# Patient Record
Sex: Female | Born: 1980 | Race: Black or African American | Hispanic: No | Smoking: Never smoker
Health system: Southern US, Community
[De-identification: ages and names within clinical notes are randomized; demographics above are authoritative.]

## PROBLEM LIST (undated history)

## (undated) DIAGNOSIS — I10 Essential (primary) hypertension: Secondary | ICD-10-CM

## (undated) DIAGNOSIS — K219 Gastro-esophageal reflux disease without esophagitis: Secondary | ICD-10-CM

## (undated) DIAGNOSIS — M1990 Unspecified osteoarthritis, unspecified site: Secondary | ICD-10-CM

## (undated) DIAGNOSIS — D649 Anemia, unspecified: Secondary | ICD-10-CM

## (undated) DIAGNOSIS — T7840XA Allergy, unspecified, initial encounter: Secondary | ICD-10-CM

## (undated) DIAGNOSIS — E785 Hyperlipidemia, unspecified: Secondary | ICD-10-CM

## (undated) DIAGNOSIS — G4730 Sleep apnea, unspecified: Secondary | ICD-10-CM

## (undated) HISTORY — DX: Anemia, unspecified: D64.9

## (undated) HISTORY — DX: Sleep apnea, unspecified: G47.30

## (undated) HISTORY — DX: Hyperlipidemia, unspecified: E78.5

## (undated) HISTORY — DX: Essential (primary) hypertension: I10

## (undated) HISTORY — DX: Allergy, unspecified, initial encounter: T78.40XA

## (undated) HISTORY — DX: Unspecified osteoarthritis, unspecified site: M19.90

## (undated) HISTORY — DX: Gastro-esophageal reflux disease without esophagitis: K21.9

---

## 2014-12-03 DIAGNOSIS — N971 Female infertility of tubal origin: Secondary | ICD-10-CM | POA: Insufficient documentation

## 2018-12-25 DIAGNOSIS — M5442 Lumbago with sciatica, left side: Secondary | ICD-10-CM | POA: Insufficient documentation

## 2019-09-17 ENCOUNTER — Other Ambulatory Visit: Payer: Self-pay

## 2019-09-18 ENCOUNTER — Encounter: Payer: Self-pay | Admitting: Gastroenterology

## 2019-09-18 ENCOUNTER — Ambulatory Visit (INDEPENDENT_AMBULATORY_CARE_PROVIDER_SITE_OTHER): Payer: 59 | Admitting: Gastroenterology

## 2019-09-18 VITALS — BP 138/84 | HR 74 | Ht 65.0 in | Wt 250.0 lb

## 2019-09-18 DIAGNOSIS — R1310 Dysphagia, unspecified: Secondary | ICD-10-CM | POA: Diagnosis not present

## 2019-09-18 DIAGNOSIS — Z01818 Encounter for other preprocedural examination: Secondary | ICD-10-CM | POA: Diagnosis not present

## 2019-09-18 DIAGNOSIS — K219 Gastro-esophageal reflux disease without esophagitis: Secondary | ICD-10-CM | POA: Diagnosis not present

## 2019-09-18 MED ORDER — SUCRALFATE 1 GM/10ML PO SUSP: 1.0000 g | Freq: Four times a day (QID) | ORAL | 1 refills | Status: DC | PRN

## 2019-09-18 MED ORDER — OMEPRAZOLE 40 MG PO CPDR: 40.0000 mg | DELAYED_RELEASE_CAPSULE | Freq: Two times a day (BID) | ORAL | 3 refills | Status: DC

## 2019-09-18 NOTE — Patient Instructions (Addendum)
If you are age 39 or older, your body mass index should be between 23-30. Your Body mass index is 41.6 kg/m. If this is out of the aforementioned range listed, please consider follow up with your Primary Care Provider.  If you are age 11 or younger, your body mass index should be between 19-25. Your Body mass index is 41.6 kg/m. If this is out of the aformentioned range listed, please consider follow up with your Primary Care Provider.   You have been scheduled for an endoscopy. Please follow written instructions given to you at your visit today. If you use inhalers (even only as needed), please bring them with you on the day of your procedure.   We have sent the following medications to your pharmacy for you to pick up at your convenience: Omeprazole 40 mg: Take twice a day, 30 minutes before a meal Carafate: Take 10 ml every 6 hours as needed  We will request your EGD records from Dr. Noe Gens at [facility]  Thank you for entrusting me with your care and for choosing [facility], Dr. Ileene Patrick

## 2019-09-18 NOTE — Progress Notes (Signed)
HPI :  39 year old female with a history of GERD, history of dysphagia, self-referred here for those symptoms.  New patient evaluation.  She is accompanied by 2 family members today.  They endorse longstanding symptoms for years.  They state the patient's had intermittent dysphagia which she localizes to her throat.  She has dysphagia to both solids and liquids in this area.  She is also had odynophagia as well.  She denies any pyrosis but does have some regurgitation, and waterbrash, states this can also make her ears ache.  She has a strong gag reflex when she brushes her teeth, states she can regurgitate " blood", states it is a very small amount.  She does not have any pain in her abdomen or epigastric area.  No nausea or vomiting.  She has been on some medications in the past, sounds like PPIs but then came off it.  She was placed on omeprazole 40 mg last week for the symptoms, states it has not helped much yet.  Symptoms can bother her at night when she sleeps.  They describe a history of 3 EGDs in the past, apparently done in [facility].  They state this has been done for dysphagia in the past.  Sounds like she has had dilations which have provided her benefit to her symptoms.  The last 1 sounds like 3 to 4 years ago, we do not have any records of these previously.  She states she did well with anesthesia.  She denies any other medical problems.  She states symptoms have worsened and recent months which led to her coming in to be reevaluated.    Past Medical History:  Diagnosis Date   GERD (gastroesophageal reflux disease)      Past Surgical History:  Procedure Laterality Date   CESAREAN SECTION     x3    Family History  Problem Relation Age of Onset   Stroke Father    Pancreatic cancer Neg Hx    Stomach cancer Neg Hx    Colon cancer Neg Hx    Esophageal cancer Neg Hx    Social History   Tobacco Use   Smoking status: Never Smoker   Smokeless tobacco: Never Used    Vaping Use   Vaping Use: Never used  Substance Use Topics   Alcohol use: Never   Drug use: Never   Current Outpatient Medications  Medication Sig Dispense Refill   omeprazole (PRILOSEC) 40 MG capsule Take 40 mg by mouth daily.     SUMAtriptan (IMITREX) 100 MG tablet Take 1 tablet by mouth as needed.     No current facility-administered medications for this visit.   Allergies  Allergen Reactions   Other Other (See Comments)    Patient does not like to take medications with dyes per patient.     Review of Systems: All systems reviewed and negative except where noted in HPI.    No results found.  Physical Exam: BP 138/84 (BP Location: Left Arm, Patient Position: Sitting, Cuff Size: Large)    Pulse 74    Ht 5\' 5"  (1.651 m)    Wt 250 lb (113.4 kg)    SpO2 100%    BMI 41.60 kg/m  Constitutional: Pleasant,well-developed, female in no acute distress. HEENT: Normocephalic and atraumatic. Conjunctivae are normal. No scleral icterus. Neck supple.  Cardiovascular: Normal rate, regular rhythm.  Pulmonary/chest: Effort normal and breath sounds normal. No wheezing, rales or rhonchi. Abdominal: Soft, nondistended, nontender.  There are no masses palpable.  Extremities: no edema Lymphadenopathy: No cervical adenopathy noted. Neurological: Alert and oriented to person place and time. Skin: Skin is warm and dry. No rashes noted. Psychiatric: Normal mood and affect. Behavior is normal.   ASSESSMENT AND PLAN: 39 year old female here for new patient assessment of the following:  Dysphagia / GERD / odynophagia - sounds like intermittent chronic symptoms of dysphagia and odynophagia, with water brash.  I do not have records of her prior endoscopies on file, but sounds like she has responded well to dilation in the past, they confidently state this has helped her.  They are asking for endoscopy to treat this, I think that is reasonable based on the symptoms she reports today.  I discussed  what endoscopy is with him, as well as dilation, risk benefits of that and anesthesia.  She wanted to proceed.  In the interim will increase omeprazole to 40 mg twice daily.  Recommend she take this 30 to 60 minutes before meal to maximize absorption and see if this helps.  I will also give them some liquid Carafate to use every 6 hours as needed to see if this will help soothe her throat.  Further recommendations pending the results of her endoscopy and her course.  We will otherwise try to get results of her prior endoscopies done in [facility] to clarify her history and findings.  She agreed with the plan, all questions answered.  Ileene Patrick, MD [facility]

## 2019-09-22 ENCOUNTER — Other Ambulatory Visit: Payer: Self-pay

## 2019-09-22 MED ORDER — SUCRALFATE 1 G PO TABS: 1.0000 g | ORAL_TABLET | Freq: Four times a day (QID) | ORAL | 3 refills | Status: DC | PRN

## 2019-09-22 NOTE — Progress Notes (Signed)
PA required for carafate suspension.  Sent script for tablets with instructions on how to make a slurry, asked pharmacy to inform patient.

## 2019-09-24 ENCOUNTER — Encounter: Payer: Self-pay | Admitting: Gastroenterology

## 2019-09-30 ENCOUNTER — Telehealth: Payer: Self-pay | Admitting: Gastroenterology

## 2019-09-30 NOTE — Telephone Encounter (Signed)
Results of prior EGDs arrived:  EGD 01/2013 - Dr. Nance Pew - "esophagitis, gastritis" - no details  EGD 02/19/13 - Dr. Nance Pew - "Barrett's esophagus, gastritis" - no details - path positive for H pylori and treated  EGD 03/04/14 - no report - only path - 3 differents biopsies of stomach, chronic gastritis HP negative, biopsies of GEJ and esophagus without barrett's

## 2019-10-01 ENCOUNTER — Ambulatory Visit (INDEPENDENT_AMBULATORY_CARE_PROVIDER_SITE_OTHER): Payer: 59

## 2019-10-01 ENCOUNTER — Other Ambulatory Visit: Payer: Self-pay | Admitting: Gastroenterology

## 2019-10-01 DIAGNOSIS — Z1159 Encounter for screening for other viral diseases: Secondary | ICD-10-CM

## 2019-10-01 LAB — SARS CORONAVIRUS 2 (TAT 6-24 HRS): SARS Coronavirus 2: NEGATIVE

## 2019-10-04 ENCOUNTER — Encounter: Payer: Self-pay | Admitting: Certified Registered Nurse Anesthetist

## 2019-10-05 ENCOUNTER — Other Ambulatory Visit: Payer: Self-pay

## 2019-10-05 ENCOUNTER — Ambulatory Visit (AMBULATORY_SURGERY_CENTER): Payer: 59 | Admitting: Gastroenterology

## 2019-10-05 ENCOUNTER — Encounter: Payer: Self-pay | Admitting: Gastroenterology

## 2019-10-05 VITALS — BP 156/96 | HR 71 | Temp 97.3°F | Resp 29 | Ht 65.0 in | Wt 250.0 lb

## 2019-10-05 DIAGNOSIS — R1310 Dysphagia, unspecified: Secondary | ICD-10-CM

## 2019-10-05 DIAGNOSIS — K228 Other specified diseases of esophagus: Secondary | ICD-10-CM | POA: Diagnosis not present

## 2019-10-05 DIAGNOSIS — K219 Gastro-esophageal reflux disease without esophagitis: Secondary | ICD-10-CM

## 2019-10-05 MED ORDER — SODIUM CHLORIDE 0.9 % IV SOLN: 500.0000 mL | INTRAVENOUS | Status: DC

## 2019-10-05 MED ORDER — OMEPRAZOLE 40 MG PO CPDR: 40.0000 mg | DELAYED_RELEASE_CAPSULE | Freq: Two times a day (BID) | ORAL | 3 refills | Status: DC

## 2019-10-05 NOTE — Progress Notes (Signed)
Vs HC

## 2019-10-05 NOTE — Progress Notes (Signed)
1615 Robinul 0.1 mg IV given due large amount of secretions upon assessment.  MD made aware, vss

## 2019-10-05 NOTE — Progress Notes (Signed)
Called to room to assist during endoscopic procedure.  Patient ID and intended procedure confirmed with present staff. Received instructions for my participation in the procedure from the performing physician.  

## 2019-10-05 NOTE — Op Note (Signed)
[facility] Patient Name: Anna Chapman Procedure Date: 10/05/2019 4:06 PM MRN: [id number] Endoscopist: Viviann Spare P. Adela Lank , MD Age: 39 Referring MD:  Date of Birth: [birth year] Gender: Female Account #: [id number] Procedure:                Upper GI endoscopy Indications:              Dysphagia, Follow-up of gastro-esophageal reflux                            disease - improved recently on omeprazole 40mg                             twice daily and addition of carafate, empiric                            dilation in the past per patient has provided                            benefit Medicines:                Monitored Anesthesia Care Procedure:                Pre-Anesthesia Assessment:                           - Prior to the procedure, a History and Physical                            was performed, and patient medications and                            allergies were reviewed. The patient's tolerance of                            previous anesthesia was also reviewed. The risks                            and benefits of the procedure and the sedation                            options and risks were discussed with the patient.                            All questions were answered, and informed consent                            was obtained. Prior Anticoagulants: The patient has                            taken no previous anticoagulant or antiplatelet                            agents. ASA Grade Assessment: III - A patient with  severe systemic disease. After reviewing the risks                            and benefits, the patient was deemed in                            satisfactory condition to undergo the procedure.                           After obtaining informed consent, the endoscope was                            passed under direct vision. Throughout the                            procedure, the patient's blood pressure, pulse, and                             oxygen saturations were monitored continuously. The                            Endoscope was introduced through the mouth, and                            advanced to the second part of duodenum. The upper                            GI endoscopy was accomplished without difficulty.                            The patient tolerated the procedure well. Scope In: Scope Out: Findings:                 Esophagogastric landmarks were identified: the                            Z-line was found at 38 cm and the gastroesophageal                            junction was found at 38 cm from the incisors.                           The exam of the esophagus was otherwise normal. No                            focal strictures / stenosis. No inflammatory                            changes.                           A guidewire was placed and the scope was withdrawn.  Empiric dilation was performed in the entire                            esophagus with a Savary dilator with mild                            resistance at 17 mm and 18 mm. Relook endoscopy                            showed no mucosal wrents. Biopsies were taken with                            a cold forceps in the upper third of the esophagus,                            in the middle third of the esophagus and in the                            lower third of the esophagus for histology to rule                            out EoE.                           The entire examined stomach was normal.                           The duodenal bulb and second portion of the                            duodenum were normal. Complications:            No immediate complications. Estimated blood loss:                            Minimal. Estimated Blood Loss:     Estimated blood loss was minimal. Impression:               - Esophagogastric landmarks identified.                           - Normal esophagus  otherwise - empiric dilation                            performed to 18mm and biopsies taken to rule out EoE                           - Normal stomach.                           - Normal duodenal bulb and second portion of the                            duodenum.  Patient has nonerosive reflux disease (NERD). Seems                            to be improved on higher dose PPI, however if                            symptoms persist consider 24 hour pH impedance                            testing and manometry. Recommendation:           - Patient has a contact number available for                            emergencies. The signs and symptoms of potential                            delayed complications were discussed with the                            patient. Return to normal activities tomorrow.                            Written discharge instructions were provided to the                            patient.                           - Resume previous diet.                           - Continue present medications. (Will refill                            omeprazole 40mg  twice daily per patient request)                           - Await pathology results and course post dilation Mackenzie Lia P. Giulliana Mcroberts, MD 10/05/2019 4:43:26 PM This report has been signed electronically.

## 2019-10-05 NOTE — Progress Notes (Signed)
Report given to PACU, vss 

## 2019-10-05 NOTE — Patient Instructions (Signed)
Refill prescription sent for Omeprazole 40mg , take twice daily.   YOU HAD AN ENDOSCOPIC PROCEDURE TODAY AT THE [facility]:   Refer to the procedure report that was given to you for any specific questions about what was found during the examination.  If the procedure report does not answer your questions, please call your gastroenterologist to clarify.  If you requested that your care partner not be given the details of your procedure findings, then the procedure report has been included in a sealed envelope for you to review at your convenience later.  YOU SHOULD EXPECT: Some feelings of bloating in the abdomen. Passage of more gas than usual.  Walking can help get rid of the air that was put into your GI tract during the procedure and reduce the bloating. If you had a lower endoscopy (such as a colonoscopy or flexible sigmoidoscopy) you may notice spotting of blood in your stool or on the toilet paper. If you underwent a bowel prep for your procedure, you may not have a normal bowel movement for a few days.  Please Note:  You might notice some irritation and congestion in your nose or some drainage.  This is from the oxygen used during your procedure.  There is no need for concern and it should clear up in a day or so.  SYMPTOMS TO REPORT IMMEDIATELY:   Following upper endoscopy (EGD)  Vomiting of blood or coffee ground material  New chest pain or pain under the shoulder blades  Painful or persistently difficult swallowing  New shortness of breath  Fever of 100F or higher  Black, tarry-looking stools  For urgent or emergent issues, a gastroenterologist can be reached at any hour by calling [phone]. Do not use MyChart messaging for urgent concerns.    DIET:  We do recommend a small meal at first, but then you may proceed to your regular diet.  Drink plenty of fluids but you should avoid alcoholic beverages for 24 hours.  ACTIVITY:  You should plan to take it easy for  the rest of today and you should NOT DRIVE or use heavy machinery until tomorrow (because of the sedation medicines used during the test).    FOLLOW UP: Our staff will call the number listed on your records 48-72 hours following your procedure to check on you and address any questions or concerns that you may have regarding the information given to you following your procedure. If we do not reach you, we will leave a message.  We will attempt to reach you two times.  During this call, we will ask if you have developed any symptoms of COVID 19. If you develop any symptoms (ie: fever, flu-like symptoms, shortness of breath, cough etc.) before then, please call [phone].  If you test positive for Covid 19 in the 2 weeks post procedure, please call and report this information to [phone].    If any biopsies were taken you will be contacted by phone or by letter within the next 1-3 weeks.  Please call us at [phone] if you have not heard about the biopsies in 3 weeks.    SIGNATURES/CONFIDENTIALITY: You and/or your care partner have signed paperwork which will be entered into your electronic medical record.  These signatures attest to the fact that that the information above on your After Visit Summary has been reviewed and is understood.  Full responsibility of the confidentiality of this discharge information lies with you and/or your care-partner.

## 2019-10-07 ENCOUNTER — Telehealth: Payer: Self-pay | Admitting: *Deleted

## 2019-10-07 NOTE — Telephone Encounter (Signed)
°  Follow up Call-  Call back number 10/05/2019  Post procedure Call Back phone  # [phone]  Permission to leave phone message Yes     Patient questions:  Message left to call us if necessary.

## 2019-10-07 NOTE — Telephone Encounter (Signed)
  Follow up Call-  Call back number 10/05/2019  Post procedure Call Back phone  # [phone]  Permission to leave phone message Yes     Patient questions:  Do you have a fever, pain , or abdominal swelling? No. Pain Score  0 *  Have you tolerated food without any problems? Yes.    Have you been able to return to your normal activities? Yes.    Do you have any questions about your discharge instructions: Diet   No. Medications  No. Follow up visit  No.  Do you have questions or concerns about your Care? No.  Actions: * If pain score is 4 or above: No action needed, pain <4.  1. Have you developed a fever since your procedure? no  2.   Have you had an respiratory symptoms (SOB or cough) since your procedure? no  3.   Have you tested positive for COVID 19 since your procedure no  4.   Have you had any family members/close contacts diagnosed with the COVID 19 since your procedure?  no   If yes to any of these questions please route to Laverna Peace, RN and Karlton Lemon, RN

## 2019-12-18 ENCOUNTER — Telehealth: Payer: Self-pay | Admitting: Family Medicine

## 2019-12-18 ENCOUNTER — Ambulatory Visit: Payer: 59 | Admitting: Family Medicine

## 2019-12-18 NOTE — Telephone Encounter (Cosign Needed)
Called pt to schedule referral appt w/ Dr. Jordan Likes for knee pain--no answer--message left.  --glh

## 2019-12-31 ENCOUNTER — Other Ambulatory Visit: Payer: Self-pay

## 2019-12-31 ENCOUNTER — Ambulatory Visit: Payer: Self-pay

## 2019-12-31 ENCOUNTER — Ambulatory Visit (INDEPENDENT_AMBULATORY_CARE_PROVIDER_SITE_OTHER): Payer: 59 | Admitting: Family Medicine

## 2019-12-31 ENCOUNTER — Encounter: Payer: Self-pay | Admitting: Family Medicine

## 2019-12-31 VITALS — BP 135/86 | HR 81 | Ht 64.0 in | Wt 250.0 lb

## 2019-12-31 DIAGNOSIS — M222X1 Patellofemoral disorders, right knee: Secondary | ICD-10-CM

## 2019-12-31 DIAGNOSIS — M222X2 Patellofemoral disorders, left knee: Secondary | ICD-10-CM

## 2019-12-31 MED ORDER — DICLOFENAC SODIUM 1 % EX GEL: 4.0000 g | Freq: Four times a day (QID) | CUTANEOUS | 11 refills | Status: DC

## 2019-12-31 MED ORDER — PREDNISONE 5 MG PO TABS: ORAL_TABLET | ORAL | 0 refills | Status: DC

## 2019-12-31 NOTE — Assessment & Plan Note (Signed)
Has significant weakness with hip abduction which is likely contributing.  She does have degenerative changes appreciated ultrasound. -Counseled on home exercise therapy and supportive care. -Prednisone. -Can try Voltaren. -Could consider injection, imaging or physical therapy.

## 2019-12-31 NOTE — Progress Notes (Signed)
  Anna Chapman - 39 y.o. female MRN [id number]  Date of birth: 04-04-1980  SUBJECTIVE:  Including CC & ROS.  Chief Complaint  Patient presents with  . Knee Pain    bilateral  . Hip Pain    bilateral    Anna Chapman is a 39 y.o. female that is presenting with acute on chronic bilateral knee pain.  Pain is been ongoing for most of the year.  She has tried medications which do alleviate the pain but the pain usually returns.  She is able to stand long periods of time as the pain exacerbates.  She denies any injury or inciting event.  Has not taken any pain medication recently.  Has not tried therapy.  No prior history of surgery.   Review of Systems See HPI   HISTORY: Past Medical, Surgical, Social, and Family History Reviewed & Updated per EMR.   Pertinent Historical Findings include:  Past Medical History:  Diagnosis Date  . GERD (gastroesophageal reflux disease)   . Hypertension     Past Surgical History:  Procedure Laterality Date  . CESAREAN SECTION     x3     Family History  Problem Relation Age of Onset  . Stroke Father   . Pancreatic cancer Neg Hx   . Stomach cancer Neg Hx   . Colon cancer Neg Hx   . Esophageal cancer Neg Hx     Social History   Socioeconomic History  . Marital status: Legally Separated    Spouse name: Not on file  . Number of children: Not on file  . Years of education: Not on file  . Highest education level: Not on file  Occupational History  . Not on file  Tobacco Use  . Smoking status: Never Smoker  . Smokeless tobacco: Never Used  Vaping Use  . Vaping Use: Never used  Substance and Sexual Activity  . Alcohol use: Never  . Drug use: Never  . Sexual activity: Not on file  Other Topics Concern  . Not on file  Social History Narrative  . Not on file   Social Determinants of Health   Financial Resource Strain: Not on file  Food Insecurity: Not on file  Transportation Needs: Not on file  Physical Activity: Not on file  Stress:  Not on file  Social Connections: Not on file  Intimate Partner Violence: Not on file     PHYSICAL EXAM:  VS: BP 135/86   Pulse 81   Ht 5\' 4"  (1.626 m)   Wt 250 lb (113.4 kg)   BMI 42.91 kg/m  Physical Exam Gen: NAD, alert, cooperative with exam, well-appearing MSK:  Right and left knee: No obvious effusion. Normal range of motion. Normal strength resistance. Weakness with hip abduction. Neurovascular intact  Limited ultrasound: Right knee:  Trace effusion. Normal-appearing quadricep and patellar tendon. Minor degenerative changes of the medial joint space and lateral joint space  Summary: Degenerative joint changes appreciated  Ultrasound and interpretation by , MD    ASSESSMENT & PLAN:   Patellofemoral pain syndrome of both knees Has significant weakness with hip abduction which is likely contributing.  She does have degenerative changes appreciated ultrasound. -Counseled on home exercise therapy and supportive care. -Prednisone. -Can try Voltaren. -Could consider injection, imaging or physical therapy.

## 2019-12-31 NOTE — Patient Instructions (Signed)
Nice to meet you Please try ice as needed Please try the exercises  Please try the rub on medicine after the prednisone   Please send me a message in MyChart with any questions or updates.  Please see me back in 4 weeks.   --Dr. Jordan Likes

## 2020-02-02 ENCOUNTER — Other Ambulatory Visit: Payer: Self-pay

## 2020-02-02 ENCOUNTER — Telehealth (INDEPENDENT_AMBULATORY_CARE_PROVIDER_SITE_OTHER): Payer: 59 | Admitting: Family Medicine

## 2020-02-02 DIAGNOSIS — M222X1 Patellofemoral disorders, right knee: Secondary | ICD-10-CM

## 2020-02-02 DIAGNOSIS — M222X2 Patellofemoral disorders, left knee: Secondary | ICD-10-CM

## 2020-02-02 NOTE — Progress Notes (Signed)
Virtual Visit via Video Note  I connected with Anna Chapman on 02/02/20 at  9:10 AM EST by a video enabled telemedicine application and verified that I am speaking with the correct person using two identifiers.  Location: Patient: home Provider: office   I discussed the limitations of evaluation and management by telemedicine and the availability of in person appointments. The patient expressed understanding and agreed to proceed.  History of Present Illness:  Ms. Anna Chapman is a 40 year old female following up for her bilateral knee pain.  She still having the pain.  No improvement with medications.  Pain seems to be staying the same.   Observations/Objective:  Gen: NAD, alert, cooperative with exam, well-appearing   Assessment and Plan:  Patellofemoral syndrome: Pain is still occurring.  No improvement with medications. -Counseled on home exercise therapy and supportive care. -Pursue injection.  Follow Up Instructions:    I discussed the assessment and treatment plan with the patient. The patient was provided an opportunity to ask questions and all were answered. The patient agreed with the plan and demonstrated an understanding of the instructions.   The patient was advised to call back or seek an in-person evaluation if the symptoms worsen or if the condition fails to improve as anticipated.    Anna Gandy, MD

## 2020-02-02 NOTE — Assessment & Plan Note (Signed)
Pain is still occurring.  No improvement with medications. -Counseled on home exercise therapy and supportive care. -Pursue injection.

## 2020-04-22 ENCOUNTER — Ambulatory Visit (INDEPENDENT_AMBULATORY_CARE_PROVIDER_SITE_OTHER): Payer: 59 | Admitting: Family Medicine

## 2020-04-22 ENCOUNTER — Ambulatory Visit: Payer: Self-pay

## 2020-04-22 ENCOUNTER — Encounter: Payer: Self-pay | Admitting: Family Medicine

## 2020-04-22 ENCOUNTER — Ambulatory Visit (HOSPITAL_BASED_OUTPATIENT_CLINIC_OR_DEPARTMENT_OTHER)
Admission: RE | Admit: 2020-04-22 | Discharge: 2020-04-22 | Disposition: A | Discharge status: Home / Self Care | Payer: 59 | Source: Ambulatory Visit | Attending: Family Medicine | Admitting: Family Medicine

## 2020-04-22 ENCOUNTER — Other Ambulatory Visit: Payer: Self-pay

## 2020-04-22 DIAGNOSIS — M222X1 Patellofemoral disorders, right knee: Secondary | ICD-10-CM

## 2020-04-22 DIAGNOSIS — M222X2 Patellofemoral disorders, left knee: Secondary | ICD-10-CM | POA: Insufficient documentation

## 2020-04-22 MED ORDER — MELOXICAM 7.5 MG PO TABS: 7.5000 mg | ORAL_TABLET | Freq: Two times a day (BID) | ORAL | 1 refills | Status: DC | PRN

## 2020-04-22 MED ORDER — TRIAMCINOLONE ACETONIDE 40 MG/ML IJ SUSP
40.0000 mg | Freq: Once | INTRAMUSCULAR | Status: AC
  Administered 2020-04-22: 40 mg via INTRA_ARTICULAR

## 2020-04-22 NOTE — Progress Notes (Signed)
Anna Chapman - 40 y.o. female MRN [id number]  Date of birth: 1980/07/13  SUBJECTIVE:  Including CC & ROS.  No chief complaint on file.   Anna Chapman is a 40 y.o. female that is presenting with acute on chronic bilateral knee pain.  She has tried home therapy and over-the-counter medication with limited improvement..   Review of Systems See HPI   HISTORY: Past Medical, Surgical, Social, and Family History Reviewed & Updated per EMR.   Pertinent Historical Findings include:  Past Medical History:  Diagnosis Date  . GERD (gastroesophageal reflux disease)   . Hypertension     Past Surgical History:  Procedure Laterality Date  . CESAREAN SECTION     x3     Family History  Problem Relation Age of Onset  . Stroke Father   . Pancreatic cancer Neg Hx   . Stomach cancer Neg Hx   . Colon cancer Neg Hx   . Esophageal cancer Neg Hx     Social History   Socioeconomic History  . Marital status: Legally Separated    Spouse name: Not on file  . Number of children: Not on file  . Years of education: Not on file  . Highest education level: Not on file  Occupational History  . Not on file  Tobacco Use  . Smoking status: Never Smoker  . Smokeless tobacco: Never Used  Vaping Use  . Vaping Use: Never used  Substance and Sexual Activity  . Alcohol use: Never  . Drug use: Never  . Sexual activity: Not on file  Other Topics Concern  . Not on file  Social History Narrative  . Not on file   Social Determinants of Health   Financial Resource Strain: Not on file  Food Insecurity: Not on file  Transportation Needs: Not on file  Physical Activity: Not on file  Stress: Not on file  Social Connections: Not on file  Intimate Partner Violence: Not on file     PHYSICAL EXAM:  VS: BP (!) 150/102 (BP Location: Left Arm, Patient Position: Sitting, Cuff Size: Large)   Ht 5\' 4"  (1.626 m)   Wt 250 lb (113.4 kg)   BMI 42.91 kg/m  Physical Exam Gen: NAD, alert, cooperative with  exam, well-appearing MSK:  Right and left knee: No obvious effusion. Normal range of motion. Normal strength resistance. Neurovascular intact   Aspiration/Injection Procedure Note Anna Chapman 1980/12/10  Procedure: Injection Indications: Left knee pain  Procedure Details Consent: Risks of procedure as well as the alternatives and risks of each were explained to the (patient/caregiver).  Consent for procedure obtained. Time Out: Verified patient identification, verified procedure, site/side was marked, verified correct patient position, special equipment/implants available, medications/allergies/relevent history reviewed, required imaging and test results available.  Performed.  The area was cleaned with iodine and alcohol swabs.    The Left knee superior lateral suprapatellar pouch was injected using 3 cc of 1% lidocaine on a 21-gauge 2 inch needle.  The syringe was switched to mixture containing 1 cc's of 40 mg Kenalog and 4 cc's of 0.25% bupivacaine was injected.  Ultrasound was used. Images were obtained in long views showing the injection.     A sterile dressing was applied.  Patient did tolerate procedure well.  Aspiration/Injection Procedure Note Anna Chapman 11/21/1980  Procedure: Injection Indications: Right knee pain  Procedure Details Consent: Risks of procedure as well as the alternatives and risks of each were explained to the (patient/caregiver).  Consent for procedure obtained. Time Out:  Verified patient identification, verified procedure, site/side was marked, verified correct patient position, special equipment/implants available, medications/allergies/relevent history reviewed, required imaging and test results available.  Performed.  The area was cleaned with iodine and alcohol swabs.    The right knee superior lateral suprapatellar pouch was injected using 3 cc of 1% lidocaine on a 21-gauge 2 inch needle.  The syringe was switched to mixture containing 1 cc's  of 40 mg Kenalog and 4 cc's of 0.25% bupivacaine was injected.  Ultrasound was used. Images were obtained in long views showing the injection.     A sterile dressing was applied.  Patient did tolerate procedure well.   ASSESSMENT & PLAN:   Patellofemoral pain syndrome of both knees Acute on chronic in nature.  May have an underlying component of degenerative changes  -Counseled on home exercise therapy and supportive care. -Injections. -X-ray. -Meloxicam. -Could consider physical therapy.

## 2020-04-22 NOTE — Assessment & Plan Note (Addendum)
Acute on chronic in nature.  May have an underlying component of degenerative changes  -Counseled on home exercise therapy and supportive care. -Injections. -X-ray. -Meloxicam. -Could consider physical therapy.

## 2020-04-22 NOTE — Patient Instructions (Signed)
Good to see you  Please try ice as needed  Please use the mobic as needed  I will call with the xray results.  Please send me a message in MyChart with any questions or updates.  Please see me back in 4 weeks.   --Dr. Jordan Likes

## 2020-04-27 ENCOUNTER — Telehealth: Payer: Self-pay | Admitting: Family Medicine

## 2020-04-27 NOTE — Telephone Encounter (Signed)
Left VM for patient. If she calls back please have her speak with a nurse/CMA and inform that her xrays are showing mild degenerative changes. We could consider physical therapy to help with the pain   If any questions then please take the best time and phone number to call and I will try to call her back.   Myra Rude, MD [facility] 04/27/2020, 8:26 AM

## 2020-05-02 ENCOUNTER — Encounter: Payer: Self-pay | Admitting: *Deleted

## 2020-05-02 NOTE — Telephone Encounter (Signed)
Letter mailed to pt.  

## 2020-05-20 ENCOUNTER — Other Ambulatory Visit: Payer: Self-pay

## 2020-05-20 ENCOUNTER — Encounter: Payer: Self-pay | Admitting: Family Medicine

## 2020-05-20 ENCOUNTER — Ambulatory Visit (INDEPENDENT_AMBULATORY_CARE_PROVIDER_SITE_OTHER): Payer: 59 | Admitting: Family Medicine

## 2020-05-20 VITALS — BP 188/118 | Ht 64.0 in | Wt 250.0 lb

## 2020-05-20 DIAGNOSIS — M222X1 Patellofemoral disorders, right knee: Secondary | ICD-10-CM

## 2020-05-20 DIAGNOSIS — M222X2 Patellofemoral disorders, left knee: Secondary | ICD-10-CM

## 2020-05-20 NOTE — Assessment & Plan Note (Signed)
Acute worsening of her bilateral knee pain.  Concern for chondral lesion to the chronicity of her symptoms.  Has been ongoing for about 6 months.  Have tried home therapy, medications and injection therapy. -Counseled on home exercise therapy and supportive care. -Referral to physical therapy. -MRI of the left and right knee to evaluate for chondral lesion or internal derangement.

## 2020-05-20 NOTE — Patient Instructions (Signed)
Good to see you Please try physical therapy  [facility]   Please send me a message in MyChart with any questions or updates.  We will setup a virtual visit once the MRI is resulted.   --Dr. Jordan Likes

## 2020-05-20 NOTE — Progress Notes (Signed)
  Anna Chapman - 40 y.o. female MRN [id number]  Date of birth: [birth year]  SUBJECTIVE:  Including CC & ROS.  No chief complaint on file.   Anna Chapman is a 40 y.o. female that is presenting with acute worsening of her bilateral knee pain.  We have tried injections and home therapy.  Tried medications with limited improvement.  Pain is still anterior in nature and worse when she is active.   Review of Systems See HPI   HISTORY: Past Medical, Surgical, Social, and Family History Reviewed & Updated per EMR.   Pertinent Historical Findings include:  Past Medical History:  Diagnosis Date  . GERD (gastroesophageal reflux disease)   . Hypertension     Past Surgical History:  Procedure Laterality Date  . CESAREAN SECTION     x3     Family History  Problem Relation Age of Onset  . Stroke Father   . Pancreatic cancer Neg Hx   . Stomach cancer Neg Hx   . Colon cancer Neg Hx   . Esophageal cancer Neg Hx     Social History   Socioeconomic History  . Marital status: Legally Separated    Spouse name: Not on file  . Number of children: Not on file  . Years of education: Not on file  . Highest education level: Not on file  Occupational History  . Not on file  Tobacco Use  . Smoking status: Never Smoker  . Smokeless tobacco: Never Used  Vaping Use  . Vaping Use: Never used  Substance and Sexual Activity  . Alcohol use: Never  . Drug use: Never  . Sexual activity: Not on file  Other Topics Concern  . Not on file  Social History Narrative  . Not on file   Social Determinants of Health   Financial Resource Strain: Not on file  Food Insecurity: Not on file  Transportation Needs: Not on file  Physical Activity: Not on file  Stress: Not on file  Social Connections: Not on file  Intimate Partner Violence: Not on file     PHYSICAL EXAM:  VS: BP (!) 188/118 (BP Location: Left Arm, Patient Position: Sitting, Cuff Size: Large)   Ht 5\' 4"  (1.626 m)   Wt 250 lb (113.4 kg)    BMI 42.91 kg/m  Physical Exam Gen: NAD, alert, cooperative with exam, well-appearing MSK:  Right and left knee: Effusion noted. Positive grind test. No instability. Neurovascular intact     ASSESSMENT & PLAN:   Patellofemoral pain syndrome of both knees Acute worsening of her bilateral knee pain.  Concern for chondral lesion to the chronicity of her symptoms.  Has been ongoing for about 6 months.  Have tried home therapy, medications and injection therapy. -Counseled on home exercise therapy and supportive care. -Referral to physical therapy. -MRI of the left and right knee to evaluate for chondral lesion or internal derangement.

## 2020-06-03 ENCOUNTER — Ambulatory Visit: Payer: 59 | Attending: Family Medicine | Admitting: Physical Therapy

## 2020-06-04 ENCOUNTER — Ambulatory Visit (HOSPITAL_BASED_OUTPATIENT_CLINIC_OR_DEPARTMENT_OTHER): Payer: 59

## 2020-06-11 ENCOUNTER — Ambulatory Visit (HOSPITAL_BASED_OUTPATIENT_CLINIC_OR_DEPARTMENT_OTHER): Payer: 59

## 2020-09-13 ENCOUNTER — Other Ambulatory Visit: Payer: Self-pay

## 2020-09-13 ENCOUNTER — Other Ambulatory Visit: Payer: Self-pay | Admitting: Gastroenterology

## 2020-09-13 ENCOUNTER — Ambulatory Visit (INDEPENDENT_AMBULATORY_CARE_PROVIDER_SITE_OTHER): Payer: 59 | Admitting: Family Medicine

## 2020-09-13 ENCOUNTER — Encounter: Payer: Self-pay | Admitting: Family Medicine

## 2020-09-13 VITALS — Ht 64.0 in | Wt 250.0 lb

## 2020-09-13 DIAGNOSIS — M25551 Pain in right hip: Secondary | ICD-10-CM | POA: Diagnosis not present

## 2020-09-13 DIAGNOSIS — M222X1 Patellofemoral disorders, right knee: Secondary | ICD-10-CM | POA: Diagnosis not present

## 2020-09-13 DIAGNOSIS — M25552 Pain in left hip: Secondary | ICD-10-CM | POA: Diagnosis not present

## 2020-09-13 DIAGNOSIS — M222X2 Patellofemoral disorders, left knee: Secondary | ICD-10-CM | POA: Diagnosis not present

## 2020-09-13 MED ORDER — METHYLPREDNISOLONE ACETATE 40 MG/ML IJ SUSP
40.0000 mg | Freq: Once | INTRAMUSCULAR | Status: AC
  Administered 2020-09-13: 40 mg via INTRAMUSCULAR

## 2020-09-13 MED ORDER — MELOXICAM 7.5 MG PO TABS: 7.5000 mg | ORAL_TABLET | Freq: Two times a day (BID) | ORAL | 1 refills | Status: DC | PRN

## 2020-09-13 MED ORDER — KETOROLAC TROMETHAMINE 30 MG/ML IJ SOLN
30.0000 mg | Freq: Once | INTRAMUSCULAR | Status: AC
  Administered 2020-09-13: 30 mg via INTRAMUSCULAR

## 2020-09-13 NOTE — Progress Notes (Signed)
  Anna Chapman - 40 y.o. female MRN [id number]  Date of birth: 10-05-1980  SUBJECTIVE:  Including CC & ROS.  No chief complaint on file.   Anna Chapman is a 40 y.o. female that is presenting with bilateral knee pain and bilateral hip pain.  The pain is been ongoing over the past few months.  She has tried knee injections with limited improvement.  She has gotten improvement with meloxicam.  Having pain with ambulation.    Review of Systems See HPI   HISTORY: Past Medical, Surgical, Social, and Family History Reviewed & Updated per EMR.   Pertinent Historical Findings include:  Past Medical History:  Diagnosis Date   GERD (gastroesophageal reflux disease)    Hypertension     Past Surgical History:  Procedure Laterality Date   CESAREAN SECTION     x3     Family History  Problem Relation Age of Onset   Stroke Father    Pancreatic cancer Neg Hx    Stomach cancer Neg Hx    Colon cancer Neg Hx    Esophageal cancer Neg Hx     Social History   Socioeconomic History   Marital status: Legally Separated    Spouse name: Not on file   Number of children: Not on file   Years of education: Not on file   Highest education level: Not on file  Occupational History   Not on file  Tobacco Use   Smoking status: Never   Smokeless tobacco: Never  Vaping Use   Vaping Use: Never used  Substance and Sexual Activity   Alcohol use: Never   Drug use: Never   Sexual activity: Not on file  Other Topics Concern   Not on file  Social History Narrative   Not on file   Social Determinants of Health   Financial Resource Strain: Not on file  Food Insecurity: Not on file  Transportation Needs: Not on file  Physical Activity: Not on file  Stress: Not on file  Social Connections: Not on file  Intimate Partner Violence: Not on file     PHYSICAL EXAM:  VS: Ht 5\' 4"  (1.626 m)   Wt 250 lb (113.4 kg)   BMI 42.91 kg/m  Physical Exam Gen: NAD, alert, cooperative with exam,  well-appearing     ASSESSMENT & PLAN:   Patellofemoral pain syndrome of both knees Acute on chronic in nature.  Has not been able to initiate physical therapy.  Has tried steroid injections and home exercise therapy with pain ongoing. -Counseled on home exercise therapy and supportive care. -IM Toradol and Depo-Medrol. -Referral to physical therapy. -Could consider further imaging  Greater trochanteric pain syndrome of both lower extremities Having pain on the lateral component of each hip.  Hip abduction weakness is playing a role. -Counseled on home exercise therapy and supportive care. -Referral to physical therapy. -Consider injection or imaging.

## 2020-09-13 NOTE — Assessment & Plan Note (Signed)
Acute on chronic in nature.  Has not been able to initiate physical therapy.  Has tried steroid injections and home exercise therapy with pain ongoing. -Counseled on home exercise therapy and supportive care. -IM Toradol and Depo-Medrol. -Referral to physical therapy. -Could consider further imaging

## 2020-09-13 NOTE — Patient Instructions (Signed)
Good to see you  Please try ice as needed  Please stop by physical therapy and set up an appointment.  Please use the mobic as needed  Please send me a message in MyChart with any questions or updates.  Please see me back in 4 weeks.   --Dr. Jordan Likes

## 2020-09-13 NOTE — Assessment & Plan Note (Signed)
Having pain on the lateral component of each hip.  Hip abduction weakness is playing a role. -Counseled on home exercise therapy and supportive care. -Referral to physical therapy. -Consider injection or imaging.

## 2020-09-22 ENCOUNTER — Ambulatory Visit: Payer: 59

## 2020-09-23 ENCOUNTER — Telehealth: Payer: Self-pay | Admitting: Family Medicine

## 2020-09-23 NOTE — Telephone Encounter (Signed)
Left VM for patient. If he calls back please have him speak with a nurse/CMA and inform that his xray is showing healing of the fracture. Continue the splint and follow up as scheduled.   If any questions then please take the best time and phone number to call and I will try to call him back.   Myra Rude, MD [facility] 09/23/2020, 2:17 PM

## 2020-10-06 ENCOUNTER — Ambulatory Visit: Payer: 59 | Attending: Family Medicine

## 2020-10-06 ENCOUNTER — Other Ambulatory Visit: Payer: Self-pay

## 2020-10-06 DIAGNOSIS — M6281 Muscle weakness (generalized): Secondary | ICD-10-CM | POA: Diagnosis present

## 2020-10-06 DIAGNOSIS — M25552 Pain in left hip: Secondary | ICD-10-CM

## 2020-10-06 DIAGNOSIS — M25562 Pain in left knee: Secondary | ICD-10-CM | POA: Insufficient documentation

## 2020-10-06 DIAGNOSIS — M25551 Pain in right hip: Secondary | ICD-10-CM | POA: Diagnosis present

## 2020-10-06 DIAGNOSIS — G8929 Other chronic pain: Secondary | ICD-10-CM

## 2020-10-06 DIAGNOSIS — M25561 Pain in right knee: Secondary | ICD-10-CM | POA: Diagnosis present

## 2020-10-06 DIAGNOSIS — R262 Difficulty in walking, not elsewhere classified: Secondary | ICD-10-CM | POA: Diagnosis present

## 2020-10-06 NOTE — Patient Instructions (Signed)
Access Code: [email or web address] URL: [email or web address] Date: 10/06/2020 Prepared by: Gardiner Rhyme  Exercises Supine Figure 4 Piriformis Stretch - 2 x daily - 7 x weekly - 3 sets - 20 seconds hold Supine Piriformis Stretch with Foot on Ground - 2 x daily - 7 x weekly - 3 sets - 20 seconds hold Hooklying Clamshell with Resistance - 1 x daily - 7 x weekly - 2 sets - 10 reps - 5 seconds hold Bridge - 1 x daily - 7 x weekly - 2 sets - 5-8 reps

## 2020-10-06 NOTE — Therapy (Signed)
[facility] [address] [place], [place], [address] Phone: [phone]   Fax:  [phone]  Physical Therapy Evaluation  Patient Details  Name: Anna Chapman MRN: [id number] Date of Birth: [birth year] Referring Provider (PT): Clare Gandy, MD   Encounter Date: 10/06/2020   PT End of Session - 10/06/20 1332     Visit Number 1    Number of Visits 16    Authorization Type [employer]    PT Start Time 0116    PT Stop Time 0200    PT Time Calculation (min) 44 min    Activity Tolerance Patient tolerated treatment well    Behavior During Therapy [facility] for tasks assessed/performed             Past Medical History:  Diagnosis Date   GERD (gastroesophageal reflux disease)    Hypertension     Past Surgical History:  Procedure Laterality Date   CESAREAN SECTION     x3     There were no vitals filed for this visit.    Subjective Assessment - 10/06/20 1320     Subjective Pt reports R knee pain began the last few years, but she found out she had arthritis earlier this year and it's been worse. B knee pain but R worse than L. Both hips hurt as well, especially when she is sleeping, causing her to have to lie on her back or on her stomach. The R does hurt worse than the L. That started last year and has stayed the same.    Limitations Sitting;Standing;Walking;House hold activities    How long can you sit comfortably? with back rest, yes    How long can you stand comfortably? more than an hour    How long can you walk comfortably? more than 10 minutes    Currently in Pain? Yes    Pain Score 9    10/10 at worst   Pain Location Knee    Pain Orientation Right;Left    Pain Descriptors / Indicators Aching;Sore    Pain Type Chronic pain    Pain Onset More than a month ago    Pain Frequency Constant    Aggravating Factors  Lying on her sides (hurts hips), walking, standing    Pain Relieving Factors Meloxicam, Voltaren (not sure  if this helps),    Multiple Pain Sites Yes    Pain Score 9   10/10 at worst   Pain Location Hip    Pain Orientation Right;Left    Pain Descriptors / Indicators Aching;Sore    Pain Type Chronic pain                [facility] PT Assessment - 10/06/20 0001       Assessment   Medical Diagnosis M22.2X1,M22.2X2 (ICD-10-CM) - Patellofemoral pain syndrome of both knees  M25.551,M25.552 (ICD-10-CM) - Greater trochanteric pain syndrome of both lower extremities    Referring Provider (PT) Clare Gandy, MD    Onset Date/Surgical Date --   a few years   Hand Dominance Right    Next MD Visit a few weeks    Prior Therapy No      Precautions   Precautions None      Restrictions   Weight Bearing Restrictions No      Balance Screen   Has the patient fallen in the past 6 months No    Has the patient had a decrease in activity level because of a fear of falling?  Yes    Is the patient reluctant to leave their home because of a fear of falling?  No      Home [employer] information [employer] residence    Living Arrangements Spouse/significant other;Children    Type of Home House    Home Access Stairs to enter    Entrance Stairs-Number of Steps 7    Entrance Stairs-Rails Right    Home Layout One level      Prior Function   Level of Independence Independent    Vocation Part time employment    Vocation Requirements Standing    Leisure Be active      Observation/Other Assessments   Focus on Therapeutic Outcomes (FOTO)  52% ability, 68% predicted      ROM / Strength   AROM / PROM / Strength Strength;AROM      AROM   Overall AROM Comments B knee flexion notably restricted when pt attempted to perform H/L, feet kept sliding down table      Strength   Overall Strength Comments R knee weaker 4-/5 pain in hip with tsretching    Strength Assessment Site Hip;Knee    Right/Left Hip Right;Left    Right Hip Flexion 4/5    Right Hip External Rotation  4-/5   pain   Right Hip  ABduction 3+/5   cannot achieve neutral extension   Left Hip Flexion 4/5    Left Hip External Rotation 4/5    Left Hip ABduction 4-/5   cannot achieve neutral extension   Right/Left Knee Left;Right    Right Knee Flexion 4-/5   pain   Right Knee Extension 4-/5   pain   Left Knee Flexion 4/5    Left Knee Extension 4/5      Flexibility   Soft Tissue Assessment /Muscle Length yes    Quadriceps limited during attempt for hip abduction, pt unable to achieve hip neutral      Palpation   Palpation comment TTP to B medial joint line of knees, B greater trochanter and peri hip tissue                        Objective measurements completed on examination: See above findings.                  PT Short Term Goals - 10/06/20 1812       PT SHORT TERM GOAL #1   Title Pt will be I and compliant with HEP.    Baseline issued at eval    Time 3    Period Weeks    Status New    Target Date 10/27/20      PT SHORT TERM GOAL #2   Title Knee and hip AROM will be assessed by visit #3 with LTG's created accordingly.    Time 3    Period Weeks    Status New    Target Date 10/27/20               PT Long Term Goals - 10/06/20 1812       PT LONG TERM GOAL #1   Title Pt will be independent with long term HEP for continued progress and maintenance.    Time 8    Period Weeks    Status New    Target Date 12/01/20      PT LONG TERM GOAL #2   Title Pt will increase knee MMT to 5/5 with mild to no  pain.    Baseline see flowsheet    Time 8    Period Weeks    Status New    Target Date 12/01/20      PT LONG TERM GOAL #3   Title Pt will increase hip MMT to at least 4+/5 with mild to no pain.    Baseline see flowsheet    Time 8    Period Weeks    Status New    Target Date 12/01/20      PT LONG TERM GOAL #4   Title Pt will report decrease of pain at worst to <5/10 with all functional acitivities, i.e. standing, walking, stairs.    Baseline 10/10 in hip and  knee    Time 8    Period Weeks    Status New    Target Date 12/01/20      PT LONG TERM GOAL #5   Title Pt will be able to lie on her sides to sleep for short durations with no report of pain awaking her.    Baseline cannot sleep on sides    Time 8    Period Weeks    Status New    Target Date 12/01/20      Additional Long Term Goals   Additional Long Term Goals Yes      PT LONG TERM GOAL #6   Title Pt will increase FOTO ability to at least 68% ability, in order to demonstrate meaningful change in perceived level of functional ability.    Baseline 52% ability    Time 8    Period Weeks    Status New    Target Date 12/01/20                    Plan - 10/06/20 1703     Clinical Impression Statement Pt is a 40 yo female who presents to OP PT with c/o B knee and hip pain that began >1 year ago, insidious in onset. Pt reports weight gain since onset of pain that has further increased pain, causing her to have difficulty working due to pain after prolonged standing. Pt is restricted in sleeping on her sides due to hip pain, performing prolonged standing and walking or sitting without back support due to hip/knee pain. She demonstrates impairments in knee AROM and MMT, as well as hip flexibility and MMT. TTP to medial joint line of B knee and B greater trochanters and peri hip tissue. She has not been performing any exercise program that includes strength and flexibility, so initiated that in HEP today. She was educated on diagnosis, prognosis, HEP, and POC verbalizing understanding and consent to tx. She would benefit from skilled PT 2x/week for 6-8 weeks to address soft tissue restrictions and muscle weakness to reduce hip/knee pain.    Personal Factors and Comorbidities Comorbidity 1;Time since onset of injury/illness/exacerbation;Fitness;Profession    Comorbidities HTN    Examination-Activity Limitations Sit;Lift;Squat;Locomotion Level;Stairs;Stand;Sleep;Transfers     Examination-Participation Restrictions Occupation;Shop;Community Activity;Cleaning;Laundry    Stability/Clinical Decision Making Stable/Uncomplicated    Clinical Decision Making Low    Rehab Potential Good    PT Frequency 2x / week    PT Duration 8 weeks    PT Treatment/Interventions ADLs/Self Care Home Management;Aquatic Therapy;Moist Heat;Iontophoresis 4mg /ml Dexamethasone;Electrical Stimulation;Cryotherapy;Therapeutic activities;Functional mobility training;Stair training;Gait training;Therapeutic exercise;Balance training;Neuromuscular re-education;Patient/family education;Taping;Manual techniques;Passive range of motion;Dry needling;Vasopneumatic Device;Joint Manipulations    PT Next Visit Plan Assess response to HEP/update PRN, assess knee and hip ROM, progress hip/LE flexibility and strength  PT Home Exercise Plan [email or web address]    Consulted and Agree with Plan of Care Patient;Family member/caregiver   daughter helped translate at times.   Family Member Consulted Daughter             Patient will benefit from skilled therapeutic intervention in order to improve the following deficits and impairments:  Difficulty walking, Pain, Increased fascial restricitons, Impaired flexibility, Decreased strength, Decreased activity tolerance, Decreased range of motion, Impaired perceived functional ability, Obesity, Improper body mechanics, Decreased mobility, Decreased endurance  Visit Diagnosis: Chronic pain of left knee  Chronic pain of right knee  Pain in left hip  Pain in right hip  Difficulty in walking, not elsewhere classified  Muscle weakness (generalized)     Problem List Patient Active Problem List   Diagnosis Date Noted   Greater trochanteric pain syndrome of both lower extremities 09/13/2020   Patellofemoral pain syndrome of both knees 12/31/2019    Marcelline Mates, PT, DPT 10/06/2020, 6:19 PM  [facility] [address] [place], [place], [address] Phone: [phone]   Fax:  [phone]  Name: Jaclynn Laumann MRN: [id number] Date of Birth: 09-22-1980

## 2020-10-11 ENCOUNTER — Ambulatory Visit: Payer: 59 | Admitting: Family Medicine

## 2020-10-11 NOTE — Progress Notes (Deleted)
  Anna Chapman - 39 y.o. female MRN [id number]  Date of birth: 11/08/1980  SUBJECTIVE:  Including CC & ROS.  No chief complaint on file.   Anna Chapman is a 39 y.o. female that is  ***.  ***   Review of Systems See HPI   HISTORY: Past Medical, Surgical, Social, and Family History Reviewed & Updated per EMR.   Pertinent Historical Findings include:  Past Medical History:  Diagnosis Date   GERD (gastroesophageal reflux disease)    Hypertension     Past Surgical History:  Procedure Laterality Date   CESAREAN SECTION     x3     Family History  Problem Relation Age of Onset   Stroke Father    Pancreatic cancer Neg Hx    Stomach cancer Neg Hx    Colon cancer Neg Hx    Esophageal cancer Neg Hx     Social History   Socioeconomic History   Marital status: Legally Separated    Spouse name: Not on file   Number of children: Not on file   Years of education: Not on file   Highest education level: Not on file  Occupational History   Not on file  Tobacco Use   Smoking status: Never   Smokeless tobacco: Never  Vaping Use   Vaping Use: Never used  Substance and Sexual Activity   Alcohol use: Never   Drug use: Never   Sexual activity: Not on file  Other Topics Concern   Not on file  Social History Narrative   Not on file   Social Determinants of Health   Financial Resource Strain: Not on file  Food Insecurity: Not on file  Transportation Needs: Not on file  Physical Activity: Not on file  Stress: Not on file  Social Connections: Not on file  Intimate Partner Violence: Not on file     PHYSICAL EXAM:  VS: There were no vitals taken for this visit. Physical Exam Gen: NAD, alert, cooperative with exam, well-appearing MSK:  ***      ASSESSMENT & PLAN:   No problem-specific Assessment & Plan notes found for this encounter.     

## 2020-10-18 ENCOUNTER — Ambulatory Visit: Payer: 59 | Attending: Family Medicine | Admitting: Physical Therapy

## 2020-10-18 ENCOUNTER — Ambulatory Visit: Payer: 59 | Admitting: Family Medicine

## 2020-10-18 DIAGNOSIS — M6281 Muscle weakness (generalized): Secondary | ICD-10-CM | POA: Insufficient documentation

## 2020-10-18 DIAGNOSIS — G8929 Other chronic pain: Secondary | ICD-10-CM | POA: Insufficient documentation

## 2020-10-18 DIAGNOSIS — M25561 Pain in right knee: Secondary | ICD-10-CM | POA: Insufficient documentation

## 2020-10-18 DIAGNOSIS — M25551 Pain in right hip: Secondary | ICD-10-CM | POA: Insufficient documentation

## 2020-10-18 DIAGNOSIS — M25562 Pain in left knee: Secondary | ICD-10-CM | POA: Insufficient documentation

## 2020-10-18 DIAGNOSIS — R262 Difficulty in walking, not elsewhere classified: Secondary | ICD-10-CM | POA: Insufficient documentation

## 2020-10-18 DIAGNOSIS — M25552 Pain in left hip: Secondary | ICD-10-CM | POA: Insufficient documentation

## 2020-10-18 NOTE — Progress Notes (Deleted)
  Anna Chapman - 40 y.o. female MRN [id number]  Date of birth: February 05, 1980  SUBJECTIVE:  Including CC & ROS.  No chief complaint on file.   Anna Chapman is a 40 y.o. female that is  ***.  ***   Review of Systems See HPI   HISTORY: Past Medical, Surgical, Social, and Family History Reviewed & Updated per EMR.   Pertinent Historical Findings include:  Past Medical History:  Diagnosis Date   GERD (gastroesophageal reflux disease)    Hypertension     Past Surgical History:  Procedure Laterality Date   CESAREAN SECTION     x3     Family History  Problem Relation Age of Onset   Stroke Father    Pancreatic cancer Neg Hx    Stomach cancer Neg Hx    Colon cancer Neg Hx    Esophageal cancer Neg Hx     Social History   Socioeconomic History   Marital status: Legally Separated    Spouse name: Not on file   Number of children: Not on file   Years of education: Not on file   Highest education level: Not on file  Occupational History   Not on file  Tobacco Use   Smoking status: Never   Smokeless tobacco: Never  Vaping Use   Vaping Use: Never used  Substance and Sexual Activity   Alcohol use: Never   Drug use: Never   Sexual activity: Not on file  Other Topics Concern   Not on file  Social History Narrative   Not on file   Social Determinants of Health   Financial Resource Strain: Not on file  Food Insecurity: Not on file  Transportation Needs: Not on file  Physical Activity: Not on file  Stress: Not on file  Social Connections: Not on file  Intimate Partner Violence: Not on file     PHYSICAL EXAM:  VS: There were no vitals taken for this visit. Physical Exam Gen: NAD, alert, cooperative with exam, well-appearing MSK:  ***      ASSESSMENT & PLAN:   No problem-specific Assessment & Plan notes found for this encounter.

## 2020-10-20 ENCOUNTER — Ambulatory Visit: Payer: 59 | Admitting: Physical Therapy

## 2020-10-20 ENCOUNTER — Other Ambulatory Visit: Payer: Self-pay

## 2020-10-20 DIAGNOSIS — M6281 Muscle weakness (generalized): Secondary | ICD-10-CM

## 2020-10-20 DIAGNOSIS — M25551 Pain in right hip: Secondary | ICD-10-CM

## 2020-10-20 DIAGNOSIS — R262 Difficulty in walking, not elsewhere classified: Secondary | ICD-10-CM

## 2020-10-20 DIAGNOSIS — G8929 Other chronic pain: Secondary | ICD-10-CM | POA: Diagnosis present

## 2020-10-20 DIAGNOSIS — M25552 Pain in left hip: Secondary | ICD-10-CM | POA: Diagnosis present

## 2020-10-20 DIAGNOSIS — M25562 Pain in left knee: Secondary | ICD-10-CM | POA: Diagnosis not present

## 2020-10-20 DIAGNOSIS — M25561 Pain in right knee: Secondary | ICD-10-CM | POA: Diagnosis present

## 2020-10-20 NOTE — Therapy (Signed)
[place]. [place], [place], [address] Phone: [phone]   Fax:  [phone]  Physical Therapy Treatment  Patient Details  Name: Anna Chapman MRN: [id number] Date of Birth: 1980/02/23 Referring Provider (PT): Clearance Coots, MD   Encounter Date: 10/20/2020   PT End of Session - 10/20/20 1450     Visit Number 2    Number of Visits 16    Authorization Type [employer]    PT Start Time 6553    PT Stop Time 7482    PT Time Calculation (min) 38 min             Past Medical History:  Diagnosis Date   GERD (gastroesophageal reflux disease)    Hypertension     Past Surgical History:  Procedure Laterality Date   CESAREAN SECTION     x3     There were no vitals filed for this visit.   Subjective Assessment - 10/20/20 1410     Subjective about the same    Currently in Pain? Yes    Pain Score 5     Pain Location Knee    Pain Orientation Right                               [facility] Adult PT Treatment/Exercise - 10/20/20 0001       Exercises   Exercises Knee/Hip      Knee/Hip Exercises: Aerobic   Nustep L 4 5 min      Knee/Hip Exercises: Standing   Heel Raises Both;2 sets;10 reps   black bar   Other Standing Knee Exercises red tband hip 3 way 10  BIL      Knee/Hip Exercises: Seated   Long Arc Quad Strengthening;Both;2 sets;10 reps    Long Arc Quad Weight 3 lbs.    Long Arc Quad Limitations VMO ball squueze    Ball Squeeze 20x    Clamshell with TheraBand Green    Marching Strengthening;Both;2 sets;10 reps   green tband   Hamstring Curl Strengthening;Both;2 sets;10 reps   green tband     Knee/Hip Exercises: Supine   Bridges Right;Both;15 reps   fee ton ball, KTC and obl     Manual Therapy   Manual Therapy Passive ROM;Soft tissue mobilization    Manual therapy comments BIL ITB very tight    Soft tissue mobilization ITB stripping to tolerance-educ daughter on how to do  with rolling pin    Passive ROM LEs                     PT Education - 10/20/20 1449     Education Details ITB stretching and rolling    Person(s) Educated Patient;Child(ren)    Methods Explanation;Demonstration    Comprehension Verbalized understanding;Returned demonstration              PT Short Term Goals - 10/20/20 1450       PT SHORT TERM GOAL #1   Title Pt will be I and compliant with HEP.    Status Achieved               PT Long Term Goals - 10/06/20 1812       PT LONG TERM GOAL #1   Title Pt will be independent with long term HEP for continued progress and maintenance.    Time 8    Period Weeks  Status New    Target Date 12/01/20      PT LONG TERM GOAL #2   Title Pt will increase knee MMT to 5/5 with mild to no pain.    Baseline see flowsheet    Time 8    Period Weeks    Status New    Target Date 12/01/20      PT LONG TERM GOAL #3   Title Pt will increase hip MMT to at least 4+/5 with mild to no pain.    Baseline see flowsheet    Time 8    Period Weeks    Status New    Target Date 12/01/20      PT LONG TERM GOAL #4   Title Pt will report decrease of pain at worst to <5/10 with all functional acitivities, i.e. standing, walking, stairs.    Baseline 10/10 in hip and knee    Time 8    Period Weeks    Status New    Target Date 12/01/20      PT LONG TERM GOAL #5   Title Pt will be able to lie on her sides to sleep for short durations with no report of pain awaking her.    Baseline cannot sleep on sides    Time 8    Period Weeks    Status New    Target Date 12/01/20      Additional Long Term Goals   Additional Long Term Goals Yes      PT LONG TERM GOAL #6   Title Pt will increase FOTO ability to at least 68% ability, in order to demonstrate meaningful change in perceived level of functional ability.    Baseline 52% ability    Time 8    Period Weeks    Status New    Target Date 12/01/20                    Plan - 10/20/20 1451     Clinical Impression Statement STG met for inital HEP,added ITB strteching and rolling. started initial ther ex progressiona nd she tolerated fair-verb pain throughout RT knee and BIL hips. postural cuing needed and cuing for speed and control of mvmt needed.    PT Treatment/Interventions ADLs/Self Care Home Management;Aquatic Therapy;Moist Heat;Iontophoresis 41m/ml Dexamethasone;Electrical Stimulation;Cryotherapy;Therapeutic activities;Functional mobility training;Stair training;Gait training;Therapeutic exercise;Balance training;Neuromuscular re-education;Patient/family education;Taping;Manual techniques;Passive range of motion;Dry needling;Vasopneumatic Device;Joint Manipulations    PT Next Visit Plan Assess response to ITB HEP/update PRN, assess knee and hip ROM, progress hip/LE flexibility and strength. Check ROM             Patient will benefit from skilled therapeutic intervention in order to improve the following deficits and impairments:  Difficulty walking, Pain, Increased fascial restricitons, Impaired flexibility, Decreased strength, Decreased activity tolerance, Decreased range of motion, Impaired perceived functional ability, Obesity, Improper body mechanics, Decreased mobility, Decreased endurance  Visit Diagnosis: Chronic pain of left knee  Chronic pain of right knee  Pain in left hip  Pain in right hip  Difficulty in walking, not elsewhere classified  Muscle weakness (generalized)     Problem List Patient Active Problem List   Diagnosis Date Noted   Greater trochanteric pain syndrome of both lower extremities 09/13/2020   Patellofemoral pain syndrome of both knees 12/31/2019    [name],[name], PTA 10/20/2020, 2:54 PM  [place] [address]: [phone]  Fax:  [phone] Name: Anna Chapman MRN: [id number] Date of Birth: October 03, 1980

## 2020-10-25 ENCOUNTER — Ambulatory Visit: Payer: 59 | Admitting: Physical Therapy

## 2020-10-26 ENCOUNTER — Ambulatory Visit: Payer: 59 | Admitting: Physical Therapy

## 2020-10-27 ENCOUNTER — Other Ambulatory Visit: Payer: Self-pay

## 2020-10-27 ENCOUNTER — Ambulatory Visit: Payer: 59 | Admitting: Physical Therapy

## 2020-10-27 DIAGNOSIS — R262 Difficulty in walking, not elsewhere classified: Secondary | ICD-10-CM

## 2020-10-27 DIAGNOSIS — M25562 Pain in left knee: Secondary | ICD-10-CM | POA: Diagnosis not present

## 2020-10-27 DIAGNOSIS — M6281 Muscle weakness (generalized): Secondary | ICD-10-CM

## 2020-10-27 DIAGNOSIS — G8929 Other chronic pain: Secondary | ICD-10-CM

## 2020-10-27 NOTE — Therapy (Signed)
[facility] [address] [place], [place], [address] Phone: [phone]   Fax:  [phone]  Physical Therapy Treatment  Patient Details  Name: Anna Chapman MRN: [id number] Date of Birth: 1981-01-01 Referring Provider (PT): Clare Gandy, MD   Encounter Date: 10/27/2020   PT End of Session - 10/27/20 1518     Visit Number 3    Number of Visits 16    Authorization Type [employer]    PT Start Time 1445    PT Stop Time 1530    PT Time Calculation (min) 45 min             Past Medical History:  Diagnosis Date   GERD (gastroesophageal reflux disease)    Hypertension     Past Surgical History:  Procedure Laterality Date   CESAREAN SECTION     x3     There were no vitals filed for this visit.   Subjective Assessment - 10/27/20 1448     Subjective maybe some better, doing HEP and daughter is rolling leg . R Tknee all the sudden is very painful    Currently in Pain? Yes    Pain Location Knee    Pain Orientation Right                [facility] PT Assessment - 10/27/20 0001       AROM   Overall AROM Comments BIL knee ext WFls, Flexion RT  105   , Left 111                           [facility] Adult PT Treatment/Exercise - 10/27/20 0001       Knee/Hip Exercises: Seated   Long Arc Quad Strengthening;Both;2 sets;10 reps    Long Arc Quad Weight 3 lbs.    Long Arc Quad Limitations VMO ball squueze    Ball Squeeze 20x    Marching Strengthening;Both;2 sets;10 reps    [employer] 3 lbs.    Hamstring Curl Strengthening;Both;2 sets;10 reps   green tband     Modalities   Modalities Ultrasound;Electrical Stimulation;Moist Heat      Moist Heat Therapy   Number Minutes Moist Heat 15 Minutes    Moist Heat Location Knee      Electrical Stimulation   Electrical Stimulation Location RT knee    Electrical Stimulation Action IFC    Electrical Stimulation Parameters supine    Electrical  Stimulation Goals Pain      Ultrasound   Ultrasound Location Rt knee    Ultrasound Parameters 1.2 w/cm2 cont. 8 min    Ultrasound Goals Edema;Pain                       PT Short Term Goals - 10/20/20 1450       PT SHORT TERM GOAL #1   Title Pt will be I and compliant with HEP.    Status Achieved               PT Long Term Goals - 10/27/20 1515       PT LONG TERM GOAL #1   Title Pt will be independent with long term HEP for continued progress and maintenance.    Status On-going      PT LONG TERM GOAL #2   Title Pt will increase knee MMT to 5/5 with mild to no pain.    Baseline toopianful on RT to  test    Status On-going      PT LONG TERM GOAL #3   Title Pt will increase hip MMT to at least 4+/5 with mild to no pain.    Status On-going      PT LONG TERM GOAL #4   Title Pt will report decrease of pain at worst to <5/10 with all functional acitivities, i.e. standing, walking, stairs.    Status On-going      PT LONG TERM GOAL #5   Title Pt will be able to lie on her sides to sleep for short durations with no report of pain awaking her.    Status On-going                   Plan - 10/27/20 1516     Clinical Impression Statement 0t arrived with 10/10 RT kne pain and RT knee buckled walking back. limited tolerance to ther ex d/t pain. pt verb doing HEP And daughter doing ST rollin to IT. No changes with goals this week. Too painful to MMT, full BIL knee ext and flexion is fair BIL. trial with modalities for pain control.    PT Treatment/Interventions ADLs/Self Care Home Management;Aquatic Therapy;Moist Heat;Iontophoresis 4mg /ml Dexamethasone;Electrical Stimulation;Cryotherapy;Therapeutic activities;Functional mobility training;Stair training;Gait training;Therapeutic exercise;Balance training;Neuromuscular re-education;Patient/family education;Taping;Manual techniques;Passive range of motion;Dry needling;Vasopneumatic Device;Joint Manipulations    PT  Next Visit Plan assess pain and progres hip/LE flexibility and strength.             Patient will benefit from skilled therapeutic intervention in order to improve the following deficits and impairments:  Difficulty walking, Pain, Increased fascial restricitons, Impaired flexibility, Decreased strength, Decreased activity tolerance, Decreased range of motion, Impaired perceived functional ability, Obesity, Improper body mechanics, Decreased mobility, Decreased endurance  Visit Diagnosis: Chronic pain of right knee  Chronic pain of left knee  Difficulty in walking, not elsewhere classified  Muscle weakness (generalized)     Problem List Patient Active Problem List   Diagnosis Date Noted   Greater trochanteric pain syndrome of both lower extremities 09/13/2020   Patellofemoral pain syndrome of both knees 12/31/2019    Anna Chapman,Anna Chapman, PTA 10/27/2020, 3:19 PM  [facility] [address] [place], [place], [place] Phone: [phone]   Fax:  [phone]  Name: Anna Chapman MRN: Anna Chapman Date of Birth: Jul 22, 1980

## 2020-11-01 ENCOUNTER — Other Ambulatory Visit: Payer: Self-pay

## 2020-11-01 ENCOUNTER — Ambulatory Visit: Payer: 59 | Admitting: Physical Therapy

## 2020-11-01 DIAGNOSIS — M25561 Pain in right knee: Secondary | ICD-10-CM

## 2020-11-01 DIAGNOSIS — G8929 Other chronic pain: Secondary | ICD-10-CM

## 2020-11-01 DIAGNOSIS — M25562 Pain in left knee: Secondary | ICD-10-CM | POA: Diagnosis not present

## 2020-11-01 DIAGNOSIS — M25551 Pain in right hip: Secondary | ICD-10-CM

## 2020-11-01 DIAGNOSIS — M6281 Muscle weakness (generalized): Secondary | ICD-10-CM

## 2020-11-01 DIAGNOSIS — M25552 Pain in left hip: Secondary | ICD-10-CM

## 2020-11-01 DIAGNOSIS — R262 Difficulty in walking, not elsewhere classified: Secondary | ICD-10-CM

## 2020-11-01 NOTE — Therapy (Signed)
[facility] [address] [place], [place], [address] Phone: [phone]   Fax:  [phone]  Physical Therapy Treatment  Patient Details  Name: Anna Chapman MRN: [id number] Date of Birth: 03-08-1980 Referring Provider (PT): Clare Gandy, MD   Encounter Date: 11/01/2020   PT End of Session - 11/01/20 1400     Visit Number 4    Number of Visits 16    Authorization Type [employer]    PT Start Time 1400    PT Stop Time 1445    PT Time Calculation (min) 45 min    Activity Tolerance Patient tolerated treatment well    Behavior During Therapy [facility] for tasks assessed/performed             Past Medical History:  Diagnosis Date   GERD (gastroesophageal reflux disease)    Hypertension     Past Surgical History:  Procedure Laterality Date   CESAREAN SECTION     x3     There were no vitals filed for this visit.   Subjective Assessment - 11/01/20 1404     Subjective Pt reports soreness after doing exercises. Reports L hip was bothering her yesterday.    Limitations Sitting;Standing;Walking;House hold activities    How long can you sit comfortably? with back rest, yes    How long can you stand comfortably? more than an hour    How long can you walk comfortably? more than 10 minutes    Currently in Pain? Yes    Pain Score 8     Pain Location Knee    Pain Orientation Right    Pain Descriptors / Indicators Aching;Sore    Pain Score 9    Pain Location Hip    Pain Orientation Left                               [facility] Adult PT Treatment/Exercise - 11/01/20 0001       Knee/Hip Exercises: Stretches   Quad Stretch Right;Left;30 seconds   prone   Hip Flexor Stretch Right;Left;20 seconds   sidelying   ITB Stretch Right;Left;20 seconds   sidelying     Knee/Hip Exercises: Aerobic   Nustep L5 x 5 min LEs      Knee/Hip Exercises: Supine   Straight Leg Raises Right;Strengthening;Left;2 sets;10 reps    short range     Knee/Hip Exercises: Sidelying   Clams 2x10      Knee/Hip Exercises: Prone   Hamstring Curl 2 sets;10 reps    Hip Extension Strengthening;Right;Left;2 sets;10 reps   knee bent   Hip Extension Limitations knee bent      Manual Therapy   Soft tissue mobilization STM quads, glutes, ITB bilat                       PT Short Term Goals - 10/20/20 1450       PT SHORT TERM GOAL #1   Title Pt will be I and compliant with HEP.    Status Achieved               PT Long Term Goals - 10/27/20 1515       PT LONG TERM GOAL #1   Title Pt will be independent with long term HEP for continued progress and maintenance.    Status On-going      PT LONG TERM GOAL #2   Title Pt  will increase knee MMT to 5/5 with mild to no pain.    Baseline toopianful on RT to test    Status On-going      PT LONG TERM GOAL #3   Title Pt will increase hip MMT to at least 4+/5 with mild to no pain.    Status On-going      PT LONG TERM GOAL #4   Title Pt will report decrease of pain at worst to <5/10 with all functional acitivities, i.e. standing, walking, stairs.    Status On-going      PT LONG TERM GOAL #5   Title Pt will be able to lie on her sides to sleep for short durations with no report of pain awaking her.    Status On-going                   Plan - 11/01/20 1443     Clinical Impression Statement Pt reports increased L hip pain and R knee pain today (she feels it flared up yesterday). Reports feeling better after performing stretches. Treatment focused on gentle manual therapy to address quad, glute, and hamstring tightness. Provided new stretches to address this. Continued to work on hip and knee strengthening.    Personal Factors and Comorbidities Comorbidity 1;Time since onset of injury/illness/exacerbation;Fitness;Profession    Comorbidities HTN    Examination-Activity Limitations Sit;Lift;Squat;Locomotion Level;Stairs;Stand;Sleep;Transfers     Examination-Participation Restrictions Occupation;Shop;Community Activity;Cleaning;Laundry    Stability/Clinical Decision Making Stable/Uncomplicated    PT Treatment/Interventions ADLs/Self Care Home Management;Aquatic Therapy;Moist Heat;Iontophoresis 4mg /ml Dexamethasone;Electrical Stimulation;Cryotherapy;Therapeutic activities;Functional mobility training;Stair training;Gait training;Therapeutic exercise;Balance training;Neuromuscular re-education;Patient/family education;Taping;Manual techniques;Passive range of motion;Dry needling;Vasopneumatic Device;Joint Manipulations    PT Next Visit Plan assess pain and progres hip/LE flexibility and strength.    PT Home Exercise Plan [email or web address]    Consulted and Agree with Plan of Care Patient;Family member/caregiver    Family Member Consulted Daughter             Patient will benefit from skilled therapeutic intervention in order to improve the following deficits and impairments:  Difficulty walking, Pain, Increased fascial restricitons, Impaired flexibility, Decreased strength, Decreased activity tolerance, Decreased range of motion, Impaired perceived functional ability, Obesity, Improper body mechanics, Decreased mobility, Decreased endurance  Visit Diagnosis: Chronic pain of right knee  Chronic pain of left knee  Difficulty in walking, not elsewhere classified  Muscle weakness (generalized)  Pain in left hip  Pain in right hip     Problem List Patient Active Problem List   Diagnosis Date Noted   Greater trochanteric pain syndrome of both lower extremities 09/13/2020   Patellofemoral pain syndrome of both knees 12/31/2019    Anna Chapman April Ma L Karmine Kauer, PT, DPT 11/01/2020, 5:55 PM  [facility] [place] [facility] [address] [place], [place], [place] Phone: [phone]   Fax:  [phone]  Name: Anna Chapman MRN: Anna Chapman Date of Birth: May 01, 1980

## 2020-11-01 NOTE — Patient Instructions (Signed)
Access Code: [email or web address] URL: [email or web address] Date: 11/01/2020 Prepared by: Vernon Prey April Kirstie Peri  Exercises Supine Piriformis Stretch with Foot on Ground - 2 x daily - 7 x weekly - 3 sets - 20 seconds hold Modified Prone Quadriceps Stretch with Strap - 1 x daily - 7 x weekly - 2 sets - 30 sec hold Sidelying TFL Stretch - 1 x daily - 7 x weekly - 2 sets - 30 sec hold Bridge - 1 x daily - 7 x weekly - 2 sets - 5-8 reps Clam - 1 x daily - 7 x weekly - 2 sets - 10 reps Small Range Straight Leg Raise - 1 x daily - 7 x weekly - 2 sets - 10 reps

## 2020-11-03 ENCOUNTER — Ambulatory Visit: Payer: 59 | Admitting: Physical Therapy

## 2020-11-03 ENCOUNTER — Other Ambulatory Visit: Payer: Self-pay

## 2020-11-03 DIAGNOSIS — M6281 Muscle weakness (generalized): Secondary | ICD-10-CM

## 2020-11-03 DIAGNOSIS — R262 Difficulty in walking, not elsewhere classified: Secondary | ICD-10-CM

## 2020-11-03 DIAGNOSIS — M25562 Pain in left knee: Secondary | ICD-10-CM

## 2020-11-03 DIAGNOSIS — G8929 Other chronic pain: Secondary | ICD-10-CM

## 2020-11-03 DIAGNOSIS — M25561 Pain in right knee: Secondary | ICD-10-CM

## 2020-11-03 NOTE — Therapy (Signed)
[facility]. [place], [place], [address] Phone: [phone]   Fax:  [phone]  Physical Therapy Treatment  Patient Details  Name: Anna Chapman MRN: [id number] Date of Birth: February 24, 1980 Referring Provider (PT): Clearance Coots, MD   Encounter Date: 11/03/2020   PT End of Session - 11/03/20 1450     Visit Number 5    Number of Visits 16    Authorization Type [employer]    PT Start Time 1025    PT Stop Time 8527    PT Time Calculation (min) 30 min             Past Medical History:  Diagnosis Date   GERD (gastroesophageal reflux disease)    Hypertension     Past Surgical History:  Procedure Laterality Date   CESAREAN SECTION     x3     There were no vitals filed for this visit.   Subjective Assessment - 11/03/20 1415     Subjective 15 min. stretches from last session helped and I am doing at home. RT knee still an issue    Currently in Pain? Yes    Pain Score 8     Pain Location Knee    Pain Orientation Right                [facility] PT Assessment - 11/03/20 0001       Strength   Right Hip Flexion 4/5    Right Hip External Rotation  4/5    Right Hip ABduction 4-/5    Left Hip Flexion 4/5    Left Hip External Rotation 4/5    Left Hip ABduction 4/5    Right Knee Flexion 4-/5    Right Knee Extension 4-/5    Left Knee Flexion 4/5    Left Knee Extension 4/5                           [facility] Adult PT Treatment/Exercise - 11/03/20 0001       Exercises   Exercises Lumbar      Lumbar Exercises: Supine   Other Supine Lumbar Exercises ppt in supine and sitting with varies ex      Knee/Hip Exercises: Aerobic   Nustep L5 x 5 min LEs      Knee/Hip Exercises: Standing   Walking with Sports Cord 20# 5 x fwd/back, 2 x each side    Other Standing Knee Exercises red tband hip 3 way 10  BIL      Knee/Hip Exercises: Seated   Clamshell with TheraBand Red   10 x BIL, 10 x  each SL   Marching Strengthening;Both;2 sets;10 reps   red tband   Hamstring Curl Strengthening;Both;2 sets;10 reps   green tband. pain on left hip and knee     Knee/Hip Exercises: Supine   Straight Leg Raises Right;Strengthening;Left;10 reps;1 set   stopped d/t pain   Other Supine Knee/Hip Exercises feet on ball KTC, obl and bridge 10 each   pain with ob;     Manual Therapy   Soft tissue mobilization STM quads, glutes, ITB bilat                       PT Short Term Goals - 10/20/20 1450       PT SHORT TERM GOAL #1   Title Pt will be I and compliant with HEP.    Status  Achieved               PT Long Term Goals - 11/03/20 1417       PT LONG TERM GOAL #1   Title Pt will be independent with long term HEP for continued progress and maintenance.    Status Partially Met      PT LONG TERM GOAL #2   Title Pt will increase knee MMT to 5/5 with mild to no pain.    Status Partially Met      PT LONG TERM GOAL #3   Title Pt will increase hip MMT to at least 4+/5 with mild to no pain.    Status Partially Met      PT LONG TERM GOAL #4   Title Pt will report decrease of pain at worst to <5/10 with all functional acitivities, i.e. standing, walking, stairs.    Baseline 8/10 RT knee    Status Partially Met      PT LONG TERM GOAL #5   Title Pt will be able to lie on her sides to sleep for short durations with no report of pain awaking her.    Status On-going                   Plan - 11/03/20 1450     Clinical Impression Statement pt with continued high pian rating on rt knee and hip, some ex stopped or limited ROM d/t pain. c/o groin pain- looked at back and core stab thinking it is referred pain. pt has very weak core and difficulty actviating even with verb and tactile cuing.    PT Treatment/Interventions ADLs/Self Care Home Management;Aquatic Therapy;Moist Heat;Iontophoresis 86m/ml Dexamethasone;Electrical Stimulation;Cryotherapy;Therapeutic  activities;Functional mobility training;Stair training;Gait training;Therapeutic exercise;Balance training;Neuromuscular re-education;Patient/family education;Taping;Manual techniques;Passive range of motion;Dry needling;Vasopneumatic Device;Joint Manipulations    PT Next Visit Plan CORE work, work on activation             Patient will benefit from skilled therapeutic intervention in order to improve the following deficits and impairments:  Difficulty walking, Pain, Increased fascial restricitons, Impaired flexibility, Decreased strength, Decreased activity tolerance, Decreased range of motion, Impaired perceived functional ability, Obesity, Improper body mechanics, Decreased mobility, Decreased endurance  Visit Diagnosis: Chronic pain of left knee  Chronic pain of right knee  Difficulty in walking, not elsewhere classified  Muscle weakness (generalized)     Problem List Patient Active Problem List   Diagnosis Date Noted   Greater trochanteric pain syndrome of both lower extremities 09/13/2020   Patellofemoral pain syndrome of both knees 12/31/2019    Faduma Cho,[name], PTA 11/03/2020, 3:03 PM  [place] [address]Phone: [phone]  Fax:  [phone] Name: ANallely YostMRN: [id number] of Birth: 112-Oct-1982

## 2020-11-17 ENCOUNTER — Ambulatory Visit: Payer: 59 | Admitting: Physical Therapy

## 2020-11-22 ENCOUNTER — Other Ambulatory Visit: Payer: Self-pay

## 2020-11-22 ENCOUNTER — Ambulatory Visit: Payer: 59 | Admitting: Physical Therapy

## 2020-11-22 ENCOUNTER — Encounter: Payer: Self-pay | Admitting: Physical Therapy

## 2020-11-22 ENCOUNTER — Ambulatory Visit: Payer: 59 | Attending: Family Medicine | Admitting: Physical Therapy

## 2020-11-22 DIAGNOSIS — G8929 Other chronic pain: Secondary | ICD-10-CM | POA: Insufficient documentation

## 2020-11-22 DIAGNOSIS — M25562 Pain in left knee: Secondary | ICD-10-CM | POA: Diagnosis present

## 2020-11-22 DIAGNOSIS — M25551 Pain in right hip: Secondary | ICD-10-CM | POA: Diagnosis present

## 2020-11-22 DIAGNOSIS — M25552 Pain in left hip: Secondary | ICD-10-CM | POA: Diagnosis present

## 2020-11-22 DIAGNOSIS — M6281 Muscle weakness (generalized): Secondary | ICD-10-CM | POA: Diagnosis present

## 2020-11-22 DIAGNOSIS — M25561 Pain in right knee: Secondary | ICD-10-CM

## 2020-11-22 DIAGNOSIS — R262 Difficulty in walking, not elsewhere classified: Secondary | ICD-10-CM | POA: Insufficient documentation

## 2020-11-22 NOTE — Therapy (Signed)
[facility]. [place], [place], [address] Phone: [phone]   Fax:  [phone]  Physical Therapy Treatment  Patient Details  Name: Anna Chapman MRN: [id number] Date of Birth: 10-15-1980 Referring Provider (PT): Clearance Coots, MD   Encounter Date: 11/22/2020   PT End of Session - 11/22/20 1750     Visit Number 6    Number of Visits Ray    PT Start Time 9507   arrived late   PT Stop Time 2257    PT Time Calculation (min) 27 min    Activity Tolerance Patient tolerated treatment well;Patient limited by pain    Behavior During Therapy [facility] for tasks assessed/performed;Flat affect             Past Medical History:  Diagnosis Date   GERD (gastroesophageal reflux disease)    Hypertension     Past Surgical History:  Procedure Laterality Date   CESAREAN SECTION     x3     There were no vitals filed for this visit.   Subjective Assessment - 11/22/20 1718     Subjective My knees are still bothering me, so are my thighs. Right knee is still worse. All the exercise is helping me.    Limitations Sitting;Standing;Walking;House hold activities    Currently in Pain? Yes    Pain Score 7     Pain Location Knee    Pain Orientation Right                               [facility] Adult PT Treatment/Exercise - 11/22/20 0001       Lumbar Exercises: Stretches   Active Hamstring Stretch Right;Left;2 reps;30 seconds    Active Hamstring Stretch Limitations at steps    Other Lumbar Stretch Exercise knee flexion stretch 1x5 with 3 second holds B   limited by pain R knee     Lumbar Exercises: Standing   Other Standing Lumbar Exercises step ups 4 inch step 1x10 B    Other Standing Lumbar Exercises hip hikes 1x10 B   limited by pain especially R side     Lumbar Exercises: Seated   Other Seated Lumbar Exercises on dynadisc: TA sets, lateral crunches. seated marches 1x10  each      Lumbar Exercises: Supine   Pelvic Tilt 15 reps    Pelvic Tilt Limitations heavy multimodal cues for correct form    Clam 10 reps;2 seconds    Clam Limitations yellow TB    Bridge 10 reps    Bridge Limitations 2 second holds and slow lower back down to the table    Straight Leg Raises Limitations attempted- unable with good form/without compensations due to core weakness                     PT Education - 11/22/20 1749     Education Details exercise form and purpose, modifications and alternatives when pain increased    Person(s) Educated Patient;Child(ren)    Methods Explanation    Comprehension Verbalized understanding              PT Short Term Goals - 10/20/20 1450       PT SHORT TERM GOAL #1   Title Pt will be I and compliant with HEP.    Status Achieved  PT Long Term Goals - 11/03/20 1417       PT LONG TERM GOAL #1   Title Pt will be independent with long term HEP for continued progress and maintenance.    Status Partially Met      PT LONG TERM GOAL #2   Title Pt will increase knee MMT to 5/5 with mild to no pain.    Status Partially Met      PT LONG TERM GOAL #3   Title Pt will increase hip MMT to at least 4+/5 with mild to no pain.    Status Partially Met      PT LONG TERM GOAL #4   Title Pt will report decrease of pain at worst to <5/10 with all functional acitivities, i.e. standing, walking, stairs.    Baseline 8/10 RT knee    Status Partially Met      PT LONG TERM GOAL #5   Title Pt will be able to lie on her sides to sleep for short durations with no report of pain awaking her.    Status On-going                   Plan - 11/22/20 1750     Clinical Impression Statement Anna Chapman arrived late today, but reports that exercises are helping; per most recent note, core strength and hips very weak today so spent a good part of session focusing on strengthening these areas before moving onto standing  exercises for hip and knee musculature. Limited by pain with standing tasks during session, and very easily fatigued- globally very weak. Continue POC as indicated. May benefit from increased focus on STM and proximal strengthening next session.    Personal Factors and Comorbidities Comorbidity 1;Time since onset of injury/illness/exacerbation;Fitness;Profession    Comorbidities HTN    Examination-Activity Limitations Sit;Lift;Squat;Locomotion Level;Stairs;Stand;Sleep;Transfers    Examination-Participation Restrictions Occupation;Shop;Community Activity;Cleaning;Laundry    Stability/Clinical Decision Making Stable/Uncomplicated    Clinical Decision Making Low    Rehab Potential Good    PT Frequency 2x / week    PT Duration 8 weeks    PT Treatment/Interventions ADLs/Self Care Home Management;Aquatic Therapy;Moist Heat;Iontophoresis 66m/ml Dexamethasone;Electrical Stimulation;Cryotherapy;Therapeutic activities;Functional mobility training;Stair training;Gait training;Therapeutic exercise;Balance training;Neuromuscular re-education;Patient/family education;Taping;Manual techniques;Passive range of motion;Dry needling;Vasopneumatic Device;Joint Manipulations    PT Next Visit Plan CORE work, work on activation. STM    PT Home Exercise Plan [id number]    Consulted and Agree with Plan of Care Patient;Family member/caregiver    Family Member Consulted Daughter             Patient will benefit from skilled therapeutic intervention in order to improve the following deficits and impairments:  Difficulty walking, Pain, Increased fascial restricitons, Impaired flexibility, Decreased strength, Decreased activity tolerance, Decreased range of motion, Impaired perceived functional ability, Obesity, Improper body mechanics, Decreased mobility, Decreased endurance  Visit Diagnosis: Chronic pain of left knee  Chronic pain of right knee  Difficulty in walking, not elsewhere classified  Muscle weakness  (generalized)  Pain in left hip  Pain in right hip     Problem List Patient Active Problem List   Diagnosis Date Noted   Greater trochanteric pain syndrome of both lower extremities 09/13/2020   Patellofemoral pain syndrome of both knees 12/31/2019   KAnn LionsPT, DPT, PN2   Supplemental Physical Therapist [facility]    Pager [phone] Rehab Office [address] [place] [address]Phone: [phone]  Fax:  [phone]  Name: Anna Chapman MRN: [id number] Date of Birth: 1980-10-28

## 2020-11-29 ENCOUNTER — Ambulatory Visit: Payer: 59 | Admitting: Physical Therapy

## 2020-12-01 ENCOUNTER — Ambulatory Visit: Payer: 59 | Admitting: Physical Therapy

## 2020-12-06 ENCOUNTER — Ambulatory Visit: Payer: 59 | Admitting: Physical Therapy

## 2020-12-06 ENCOUNTER — Other Ambulatory Visit: Payer: Self-pay | Admitting: Gastroenterology

## 2020-12-13 ENCOUNTER — Ambulatory Visit: Payer: 59 | Admitting: Physical Therapy

## 2020-12-15 ENCOUNTER — Ambulatory Visit: Payer: 59 | Admitting: Physical Therapy

## 2021-02-17 ENCOUNTER — Ambulatory Visit: Payer: Self-pay | Admitting: Family Medicine

## 2021-02-28 ENCOUNTER — Other Ambulatory Visit: Payer: Self-pay

## 2021-02-28 ENCOUNTER — Encounter: Payer: Self-pay | Admitting: Family Medicine

## 2021-02-28 ENCOUNTER — Ambulatory Visit (HOSPITAL_BASED_OUTPATIENT_CLINIC_OR_DEPARTMENT_OTHER)
Admission: RE | Admit: 2021-02-28 | Discharge: 2021-02-28 | Disposition: A | Discharge status: Home / Self Care | Payer: Self-pay | Source: Ambulatory Visit | Attending: Family Medicine | Admitting: Family Medicine

## 2021-02-28 ENCOUNTER — Ambulatory Visit (INDEPENDENT_AMBULATORY_CARE_PROVIDER_SITE_OTHER): Payer: Self-pay | Admitting: Family Medicine

## 2021-02-28 VITALS — BP 148/93 | Ht 64.0 in | Wt 250.0 lb

## 2021-02-28 DIAGNOSIS — M48062 Spinal stenosis, lumbar region with neurogenic claudication: Secondary | ICD-10-CM

## 2021-02-28 DIAGNOSIS — M25551 Pain in right hip: Secondary | ICD-10-CM

## 2021-02-28 DIAGNOSIS — M25552 Pain in left hip: Secondary | ICD-10-CM

## 2021-02-28 MED ORDER — HYDROCODONE-ACETAMINOPHEN 5-325 MG PO TABS
1.0000 | ORAL_TABLET | Freq: Three times a day (TID) | ORAL | 0 refills | Status: DC | PRN
Start: 2021-02-28 — End: 2022-12-05

## 2021-02-28 NOTE — Patient Instructions (Signed)
Good to see you Please use the pain medicine as needed at night  I will call with the results from today   Please send me a message in MyChart with any questions or updates.  Please see me back in 4 weeks.   --Dr. Jordan Likes

## 2021-02-28 NOTE — Assessment & Plan Note (Addendum)
Acutely occurring.  She is having pain that originates at the lateral hip and radiates down to the knee.  Seems more spinal stenosis in nature.  No low back pain -Counseled on home exercise therapy and supportive care. -Lumbar x-ray. -Norco -Could consider gabapentin or further imaging.

## 2021-02-28 NOTE — Assessment & Plan Note (Signed)
Acute on chronic in nature.  Having lateral hip pain that radiates down to the knee.  No improvement with physical therapy. -Counseled on home exercise therapy and supportive care. -X-ray. -May need to consider injection or further imaging

## 2021-02-28 NOTE — Progress Notes (Signed)
°  Anna Chapman - 41 y.o. female MRN [id number]  Date of birth: Oct 30, 1980  SUBJECTIVE:  Including CC & ROS.  No chief complaint on file.   Anna Chapman is a 41 y.o. female that is presenting with bilateral hip and leg pain.  She has been in physical therapy for several weeks late last fall.  She experienced pain over the lateral hip that radiates down to the knee.  The pain is getting worse at night.  No improvement with injections and medications.   Review of Systems See HPI   HISTORY: Past Medical, Surgical, Social, and Family History Reviewed & Updated per EMR.   Pertinent Historical Findings include:  Past Medical History:  Diagnosis Date   GERD (gastroesophageal reflux disease)    Hypertension     Past Surgical History:  Procedure Laterality Date   CESAREAN SECTION     x3      PHYSICAL EXAM:  VS: BP (!) 148/93 (BP Location: Right Arm, Patient Position: Sitting)    Ht 5\' 4"  (1.626 m)    Wt 250 lb (113.4 kg)    BMI 42.91 kg/m  Physical Exam Gen: NAD, alert, cooperative with exam, well-appearing MSK:  Neurovascularly intact       ASSESSMENT & PLAN:   Spinal stenosis, lumbar region, with neurogenic claudication Acutely occurring.  She is having pain that originates at the lateral hip and radiates down to the knee.  Seems more spinal stenosis in nature.  No low back pain -Counseled on home exercise therapy and supportive care. -Lumbar x-ray. -Norco -Could consider gabapentin or further imaging.  Greater trochanteric pain syndrome of both lower extremities Acute on chronic in nature.  Having lateral hip pain that radiates down to the knee.  No improvement with physical therapy. -Counseled on home exercise therapy and supportive care. -X-ray. -May need to consider injection or further imaging

## 2021-03-07 ENCOUNTER — Telehealth: Payer: Self-pay | Admitting: Family Medicine

## 2021-03-07 NOTE — Telephone Encounter (Signed)
Informed of results. Could consider MRI of hip or lumbar if pain is ongoing.   Myra Rude, MD [facility] 03/07/2021, 5:42 PM

## 2021-03-28 ENCOUNTER — Ambulatory Visit: Payer: Self-pay | Admitting: Family Medicine

## 2021-05-02 ENCOUNTER — Encounter: Payer: Self-pay | Admitting: Physical Therapy

## 2021-05-02 NOTE — Therapy (Signed)
[facility] [address] ?[place], [place], [address] ?Phone: [phone]   Fax:  [phone] ? ?Patient Details  ?Name: Anna Chapman ?MRN: [id number] ?Date of Birth: March 11, 1980 ?Referring Provider:  No ref. provider found ? ?Encounter Date: 05/02/2021 ? ?PHYSICAL THERAPY DISCHARGE SUMMARY ? ?Visits from Start of Care: 6 ? ?Current functional level related to goals / functional outcomes: ?Did not return since last visit  ?  ?Remaining deficits: ?Unable to assess  ?  ?Education / Equipment: ?N/A   ? ?Patient agrees to discharge. Patient goals were partially met. Patient is being discharged due to not returning since the last visit. ? ?Dajanay Northrup U PT, DPT, PN2  ? ?Supplemental Physical Therapist ?[facility]  ? ? ? ? ? ?[facility] ?[address] ?[place], [place], [address] ?Phone: [phone]   Fax:  [phone] ?

## 2022-03-18 DIAGNOSIS — I509 Heart failure, unspecified: Secondary | ICD-10-CM | POA: Insufficient documentation

## 2022-05-01 ENCOUNTER — Encounter: Payer: Self-pay | Admitting: *Deleted

## 2022-12-05 ENCOUNTER — Encounter: Payer: Self-pay | Admitting: Physician Assistant

## 2022-12-05 ENCOUNTER — Ambulatory Visit (INDEPENDENT_AMBULATORY_CARE_PROVIDER_SITE_OTHER): Payer: Commercial Managed Care - HMO | Admitting: Physician Assistant

## 2022-12-05 VITALS — BP 150/88 | HR 77 | Resp 18 | Ht 64.0 in | Wt 290.4 lb

## 2022-12-05 DIAGNOSIS — I10 Essential (primary) hypertension: Secondary | ICD-10-CM | POA: Diagnosis not present

## 2022-12-05 DIAGNOSIS — I509 Heart failure, unspecified: Secondary | ICD-10-CM | POA: Diagnosis not present

## 2022-12-05 DIAGNOSIS — R739 Hyperglycemia, unspecified: Secondary | ICD-10-CM

## 2022-12-05 DIAGNOSIS — D649 Anemia, unspecified: Secondary | ICD-10-CM

## 2022-12-05 DIAGNOSIS — K219 Gastro-esophageal reflux disease without esophagitis: Secondary | ICD-10-CM | POA: Diagnosis not present

## 2022-12-05 DIAGNOSIS — R079 Chest pain, unspecified: Secondary | ICD-10-CM

## 2022-12-05 MED ORDER — PANTOPRAZOLE SODIUM 40 MG PO TBEC: 40.0000 mg | DELAYED_RELEASE_TABLET | Freq: Every day | ORAL | 3 refills | Status: DC

## 2022-12-05 MED ORDER — SUCRALFATE 1 G PO TABS: 1.0000 g | ORAL_TABLET | Freq: Three times a day (TID) | ORAL | 0 refills | Status: DC

## 2022-12-05 NOTE — Assessment & Plan Note (Signed)
Uncontrolled Managed with bisoprolol 6 mg hydrochlorothiazide 6.25mg , entresto, torsemide Will hold off on med increase as may be 2/2 anxiety Referring to cardiology Ordered cmp

## 2022-12-05 NOTE — Assessment & Plan Note (Signed)
Pt managed on entresto, spirololactone, and torsemide.  Euvolemic today. Referring to cardiology

## 2022-12-05 NOTE — Progress Notes (Signed)
New patient visit   Patient: Anna Chapman   DOB: Apr 22, 1980   42 y.o. Female  MRN: [id number] Visit Date: 12/05/2022  Today's healthcare provider: Alfredia Ferguson, PA-C   Cc. New pt, establish care, chest pain  Subjective    Anna Chapman is a 42 y.o. female who presents today as a new patient to establish care.  Discussed the use of AI scribe software for clinical note transcription with the patient, who gave verbal consent to proceed.  History of Present Illness   The patient, with a history of heart failure, presents with persistent acid reflux and chest discomfort. The symptoms have been ongoing for a while and have been worsening, particularly in the mornings and afternoons. The patient reports that the discomfort is located in the middle of the chest and has recently started to radiate lower. The discomfort intensifies after eating, and occasionally, the patient experiences a sensation of wanting to vomit, although she does not actually vomit. The patient denies experiencing these symptoms when lying down at night.  The patient has tried multiple treatments for her acid reflux, including a capsule medication (likely omeprazole), a tablet medication, and a liquid medication. However, none of these treatments have provided relief.   In addition to the acid reflux and chest discomfort,The patient also reports feeling fatigued, particularly when walking.   The patient has a history of undergoing endoscopic procedures for her stomach issues, but these have been put on hold due to concerns about her heart condition. The patient has not seen a cardiologist recently but is open to seeing one. The patient also expresses a desire to lose weight, as she believes it would improve her overall health and energy levels.      Past Medical History:  Diagnosis Date   GERD (gastroesophageal reflux disease)    Hypertension    Past Surgical History:  Procedure Laterality Date   CESAREAN SECTION      x3    Family Status  Relation Name Status   Mother  Deceased   Father  Deceased   Brother  Alive   Brother  Alive   Brother  Alive   Neg Hx  (Not Specified)  No partnership data on file   Family History  Problem Relation Age of Onset   Stroke Father    Pancreatic cancer Neg Hx    Stomach cancer Neg Hx    Colon cancer Neg Hx    Esophageal cancer Neg Hx    Social History   Socioeconomic History   Marital status: Legally Separated    Spouse name: Not on file   Number of children: Not on file   Years of education: Not on file   Highest education level: Not on file  Occupational History   Not on file  Tobacco Use   Smoking status: Never   Smokeless tobacco: Never  Vaping Use   Vaping status: Never Used  Substance and Sexual Activity   Alcohol use: Never   Drug use: Never   Sexual activity: Not on file  Other Topics Concern   Not on file  Social History Narrative   Not on file   Social Determinants of Health   Financial Resource Strain: Not on file  Food Insecurity: No Food Insecurity (06/06/2020)   Received from [facility], [facility]   Hunger Vital Sign    Worried About Running Out of Food in the Last Year: Never true    Ran Out of Food in the Last  Year: Never true  Transportation Needs: Not on file  Physical Activity: Not on file  Stress: Not on file  Social Connections: Unknown (05/30/2021)   Received from [facility], [facility]   Social Network    Social Network: Not on file   Outpatient Medications Prior to Visit  Medication Sig   bisoprolol-hydrochlorothiazide (ZIAC) 5-6.25 MG tablet Take 1 tablet by mouth daily.   Sacubitril-Valsartan (ENTRESTO) 15-16 MG CPSP Take by mouth.   spironolactone (ALDACTONE) 25 MG tablet Take 25 mg by mouth daily.   torsemide (DEMADEX) 20 MG tablet Take 40 mg by mouth daily.   [DISCONTINUED] amlodipine-atorvastatin (CADUET) 10-10 MG tablet Take 1 tablet by mouth daily. Daughter reports for mom (Patient not  taking: Reported on 12/05/2022)   [DISCONTINUED] diclofenac Sodium (VOLTAREN) 1 % GEL Apply 4 g topically 4 (four) times daily. To affected joint. (Patient not taking: Reported on 12/05/2022)   [DISCONTINUED] HYDROcodone-acetaminophen (NORCO/VICODIN) 5-325 MG tablet Take 1 tablet by mouth every 8 (eight) hours as needed. (Patient not taking: Reported on 12/05/2022)   [DISCONTINUED] meloxicam (MOBIC) 7.5 MG tablet Take 1 tablet (7.5 mg total) by mouth 2 (two) times daily as needed for pain. (Patient not taking: Reported on 12/05/2022)   [DISCONTINUED] omeprazole (PRILOSEC) 40 MG capsule Take 1 capsule (40 mg total) by mouth 2 (two) times daily before a meal. **PLEASE SCHEDULE FOLLOW UP APPOINTMENT (Patient not taking: Reported on 12/05/2022)   [DISCONTINUED] SUMAtriptan (IMITREX) 100 MG tablet Take 1 tablet by mouth as needed. (Patient not taking: Reported on 10/05/2019)   No facility-administered medications prior to visit.   Allergies  Allergen Reactions   Other Other (See Comments)    Patient does not like to take medications with dyes per patient.     There is no immunization history on file for this patient.  Health Maintenance  Topic Date Due   HIV Screening  Never done   Hepatitis C Screening  Never done   DTaP/Tdap/Td (1 - Tdap) Never done   Cervical Cancer Screening (HPV/Pap Cotest)  06/12/2011   INFLUENZA VACCINE  Never done   COVID-19 Vaccine (1 - 2023-24 season) Never done   HPV VACCINES  Aged Out    Patient Care Team: Alfredia Ferguson, PA-C as PCP - General (Physician Assistant)  Review of Systems  Constitutional:  Negative for fatigue and fever.  Respiratory:  Negative for cough and shortness of breath.   Cardiovascular:  Positive for chest pain. Negative for leg swelling.  Gastrointestinal:  Positive for abdominal pain.  Neurological:  Negative for dizziness and headaches.        Objective    BP (!) 150/88   Pulse 77   Resp 18   Ht 5\' 4"  (1.626 m)   Wt 290  lb 6.4 oz (131.7 kg)   SpO2 98%   BMI 49.85 kg/m     Physical Exam Constitutional:      General: She is awake.     Appearance: She is well-developed.  HENT:     Head: Normocephalic.  Eyes:     Conjunctiva/sclera: Conjunctivae normal.  Cardiovascular:     Rate and Rhythm: Normal rate and regular rhythm.     Heart sounds: Normal heart sounds.  Pulmonary:     Effort: Pulmonary effort is normal.     Breath sounds: Normal breath sounds.  Skin:    General: Skin is warm.  Neurological:     Mental Status: She is alert and oriented to person, place, and time.  Psychiatric:        Attention and Perception: Attention normal.        Mood and Affect: Mood normal.        Speech: Speech normal.        Behavior: Behavior is cooperative.    Depression Screen    12/05/2022    1:47 PM  PHQ 2/9 Scores  PHQ - 2 Score 0   No results found for any visits on 12/05/22.  Assessment & Plan     Gastroesophageal reflux disease, unspecified whether esophagitis present Assessment & Plan: Uncontrolled. H/o omeprazole failure Will try pantoprazole today, rx 10 days of carafate w/ meals. Referring to GI and will get records from previous GI  Orders: -     Pantoprazole Sodium; Take 1 tablet (40 mg total) by mouth daily.  Dispense: 30 tablet; Refill: 3 -     Sucralfate; Take 1 tablet (1 g total) by mouth 4 (four) times daily -  with meals and at bedtime for 10 days.  Dispense: 40 tablet; Refill: 0 -     Ambulatory referral to Gastroenterology  Chest pain, unspecified type Assessment & Plan: Unclear if cardiac vs gerd. EKG today normal sinus.  Given strict ED guidelines for chest pain Referring to cardio and GI  Orders: -     EKG 12-Lead  Chronic heart failure, unspecified heart failure type [facility]) Assessment & Plan: Pt managed on entresto, spirololactone, and torsemide.  Euvolemic today. Referring to cardiology  Orders: -     EKG 12-Lead -     Ambulatory referral to Cardiology -      Brain natriuretic peptide  Primary hypertension Assessment & Plan: Uncontrolled Managed with bisoprolol 6 mg hydrochlorothiazide 6.25mg , entresto, torsemide Will hold off on med increase as may be 2/2 anxiety Referring to cardiology Ordered cmp   Hyperglycemia -     Hemoglobin A1c -     Comprehensive metabolic panel  Anemia, unspecified type Assessment & Plan: Seen on recent labs, will r/o iron def, vit b12 def, folate def, repeat cbc  Orders: -     CBC with Differential/Platelet -     IBC + Ferritin -     Vitamin B12   General Health Maintenance Discussed diet, exercise, and weight management. Compliant with low-salt diet but struggles with weight. Potential use of weight loss medication pending cardiology evaluation and insurance coverage.  Follow-up - Schedule cardiology appointment within two weeks - Perform EKG today - Follow-up primary care visit after cardiology evaluation.       Return if symptoms worsen or fail to improve, for will determine f/u based on labs and cardiologist appt.     Alfredia Ferguson, PA-C  [facility] at [facility] [phone] (phone) [phone] (fax)  [facility]

## 2022-12-05 NOTE — Assessment & Plan Note (Signed)
Unclear if cardiac vs gerd. EKG today normal sinus.  Given strict ED guidelines for chest pain Referring to cardio and GI

## 2022-12-05 NOTE — Assessment & Plan Note (Signed)
Uncontrolled. H/o omeprazole failure Will try pantoprazole today, rx 10 days of carafate w/ meals. Referring to GI and will get records from previous GI

## 2022-12-05 NOTE — Assessment & Plan Note (Signed)
Seen on recent labs, will r/o iron def, vit b12 def, folate def, repeat cbc

## 2022-12-06 ENCOUNTER — Encounter: Payer: Self-pay | Admitting: Physician Assistant

## 2022-12-06 DIAGNOSIS — R7303 Prediabetes: Secondary | ICD-10-CM | POA: Insufficient documentation

## 2022-12-06 DIAGNOSIS — D509 Iron deficiency anemia, unspecified: Secondary | ICD-10-CM | POA: Insufficient documentation

## 2022-12-06 LAB — CBC WITH DIFFERENTIAL/PLATELET
Basophils Absolute: 0.1 10*3/uL (ref 0.0–0.1)
Basophils Relative: 1.2 % (ref 0.0–3.0)
Eosinophils Absolute: 0.3 10*3/uL (ref 0.0–0.7)
Eosinophils Relative: 3.1 % (ref 0.0–5.0)
HCT: 33.6 % — ABNORMAL LOW (ref 36.0–46.0)
Hemoglobin: 10.8 g/dL — ABNORMAL LOW (ref 12.0–15.0)
Lymphocytes Relative: 23.9 % (ref 12.0–46.0)
Lymphs Abs: 2.2 10*3/uL (ref 0.7–4.0)
MCHC: 32.2 g/dL (ref 30.0–36.0)
MCV: 83.9 fL (ref 78.0–100.0)
Monocytes Absolute: 0.9 10*3/uL (ref 0.1–1.0)
Monocytes Relative: 10.1 % (ref 3.0–12.0)
Neutro Abs: 5.8 10*3/uL (ref 1.4–7.7)
Neutrophils Relative %: 61.7 % (ref 43.0–77.0)
Platelets: 360 10*3/uL (ref 150.0–400.0)
RBC: 4.01 Mil/uL (ref 3.87–5.11)
RDW: 16.4 % — ABNORMAL HIGH (ref 11.5–15.5)
WBC: 9.4 10*3/uL (ref 4.0–10.5)

## 2022-12-06 LAB — COMPREHENSIVE METABOLIC PANEL
ALT: 14 U/L (ref 0–35)
AST: 15 U/L (ref 0–37)
Albumin: 3.9 g/dL (ref 3.5–5.2)
Alkaline Phosphatase: 79 U/L (ref 39–117)
BUN: 14 mg/dL (ref 6–23)
CO2: 25 meq/L (ref 19–32)
Calcium: 9.1 mg/dL (ref 8.4–10.5)
Chloride: 103 meq/L (ref 96–112)
Creatinine, Ser: 0.72 mg/dL (ref 0.40–1.20)
GFR: 103.43 mL/min (ref >60.00)
Glucose, Bld: 86 mg/dL (ref 70–99)
Potassium: 3.8 meq/L (ref 3.5–5.1)
Sodium: 137 meq/L (ref 135–145)
Total Bilirubin: 0.5 mg/dL (ref 0.2–1.2)
Total Protein: 7.4 g/dL (ref 6.0–8.3)

## 2022-12-06 LAB — IBC + FERRITIN
Ferritin: 11.9 ng/mL (ref 10.0–291.0)
Iron: 30 ug/dL — ABNORMAL LOW (ref 42–145)
Saturation Ratios: 6.9 % — ABNORMAL LOW (ref 20.0–50.0)
TIBC: 436.8 ug/dL (ref 250.0–450.0)
Transferrin: 312 mg/dL (ref 212.0–360.0)

## 2022-12-06 LAB — BRAIN NATRIURETIC PEPTIDE: Pro B Natriuretic peptide (BNP): 53 pg/mL (ref 0.0–100.0)

## 2022-12-06 LAB — VITAMIN B12: Vitamin B-12: 510 pg/mL (ref 211–911)

## 2022-12-06 LAB — HEMOGLOBIN A1C: Hgb A1c MFr Bld: 6.1 % (ref 4.6–6.5)

## 2022-12-07 ENCOUNTER — Encounter: Payer: Self-pay | Admitting: Internal Medicine

## 2022-12-07 ENCOUNTER — Ambulatory Visit: Payer: Commercial Managed Care - HMO | Attending: Internal Medicine | Admitting: Internal Medicine

## 2022-12-07 VITALS — BP 128/80 | HR 69 | Ht 65.0 in | Wt 287.8 lb

## 2022-12-07 DIAGNOSIS — Z6841 Body Mass Index (BMI) 40.0 and over, adult: Secondary | ICD-10-CM

## 2022-12-07 DIAGNOSIS — I5022 Chronic systolic (congestive) heart failure: Secondary | ICD-10-CM | POA: Diagnosis not present

## 2022-12-07 DIAGNOSIS — I10 Essential (primary) hypertension: Secondary | ICD-10-CM

## 2022-12-07 MED ORDER — TORSEMIDE 20 MG PO TABS: 40.0000 mg | ORAL_TABLET | Freq: Every day | ORAL | 3 refills | Status: AC

## 2022-12-07 MED ORDER — METOPROLOL SUCCINATE ER 25 MG PO TB24: 12.5000 mg | ORAL_TABLET | Freq: Every day | ORAL | 3 refills | Status: DC

## 2022-12-07 MED ORDER — SPIRONOLACTONE 25 MG PO TABS: 25.0000 mg | ORAL_TABLET | Freq: Every day | ORAL | 3 refills | Status: DC

## 2022-12-07 MED ORDER — SACUBITRIL-VALSARTAN 97-103 MG PO TABS: 1.0000 | ORAL_TABLET | Freq: Two times a day (BID) | ORAL | 3 refills | Status: DC

## 2022-12-07 MED ORDER — DAPAGLIFLOZIN PROPANEDIOL 10 MG PO TABS: 10.0000 mg | ORAL_TABLET | Freq: Every day | ORAL | 3 refills | Status: DC

## 2022-12-07 NOTE — Progress Notes (Signed)
Cardiology Office Note:   Date:  12/07/2022  ID:  Anna Chapman, DOB May 29, 1980, MRN [id number] PCP:  Alfredia Ferguson, PA-C  [facility] Providers Cardiologist:  Alverda Skeans, MD Referring MD: Alfredia Ferguson, PA-C  Chief Complaint/Reason for Referral: Heart failure ASSESSMENT:    1. Chronic systolic heart failure (HCC)   2. Primary hypertension   3. BMI 45.0-49.9, adult (HCC)     PLAN:   In order of problems listed above: Heart failure: Will refer for echocardiogram to evaluate LV function.  If still abnormal will refer for coronary angiography and right heart catheterization to characterize her cardiomyopathy and assess her cardiac hemodynamics.  Increase Entresto to high dose, continue spironolactone 25, discontinue propofol I will/hydrochlorothiazide and start Toprol-XL 12.5 mg at bedtime and Farxiga 10 mg.  Continue torsemide 40 mg daily.  Check BMP in a week.  May need cardiac MRI in the future. Hypertension: Blood pressure could be better controlled.  Will increase Entresto as detailed above. Elevated BMI: Per PCP; consider GLP-1 receptor agonist therapy.            Dispo:  No follow-ups on file.      Medication Adjustments/Labs and Tests Ordered: Current medicines are reviewed at length with the patient today.  Concerns regarding medicines are outlined above.  The following changes have been made:     Labs/tests ordered: Orders Placed This Encounter  Procedures   Basic Metabolic Panel (BMET)   ECHOCARDIOGRAM COMPLETE    Medication Changes: Meds ordered this encounter  Medications   spironolactone (ALDACTONE) 25 MG tablet    Sig: Take 1 tablet (25 mg total) by mouth daily.    Dispense:  90 tablet    Refill:  3   torsemide (DEMADEX) 20 MG tablet    Sig: Take 2 tablets (40 mg total) by mouth daily.    Dispense:  180 tablet    Refill:  3   sacubitril-valsartan (ENTRESTO) 97-103 MG    Sig: Take 1 tablet by mouth 2 (two) times daily.    Dispense:  180  tablet    Refill:  3   metoprolol succinate (TOPROL XL) 25 MG 24 hr tablet    Sig: Take 0.5 tablets (12.5 mg total) by mouth at bedtime.    Dispense:  45 tablet    Refill:  3    Stop ZIAC   dapagliflozin propanediol (FARXIGA) 10 MG TABS tablet    Sig: Take 1 tablet (10 mg total) by mouth daily.    Dispense:  90 tablet    Refill:  3    Current medicines are reviewed at length with the patient today.  The patient does not have concerns regarding medicines.    History of Present Illness:      FOCUSED PROBLEM LIST:   BMI 49 Hypertension Cardiomyopathy; presumed nonischemic Mild concentric LVH; EF 35 to 40% TTE [facility] 2024 Admission for heart failure February 2024 [facility] GERD  November 2024: The patient is a 42 year old female with the above listed medical problems referred for conditions regarding heart failure.  Apparently the patient was admitted to an outside hospital in February with acute on chronic systolic heart failure.  An echocardiogram at that time demonstrated ejection fraction 35 to 40% with left ventricular hypertrophy.  She was diuresed and Entresto and Aldactone were started.  She did not undergo cardiac catheterization or coronary CTA imaging.  She was seen recently by her primary care provider.  A proBNP was drawn which was 53 and not elevated.  She is here to establish care.    The patient has issues with chest discomfort.  She tells me when she wakes up she develops chest discomfort.  It is tender to palpation.  She does not report any chest discomfort when she exerts herself.  She denies any shortness of breath.  She occasionally develops some peripheral edema but this seems to be better after night sleep.  She fortunately does not required any emergency room visits or hospitalizations.  She has had no syncope, palpitations, paroxysmal nocturnal dyspnea, orthopnea.  She is compliant with her medical therapy.          Current Medications: Current Meds  Medication  Sig   dapagliflozin propanediol (FARXIGA) 10 MG TABS tablet Take 1 tablet (10 mg total) by mouth daily.   metoprolol succinate (TOPROL XL) 25 MG 24 hr tablet Take 0.5 tablets (12.5 mg total) by mouth at bedtime.   pantoprazole (PROTONIX) 40 MG tablet Take 1 tablet (40 mg total) by mouth daily.   sacubitril-valsartan (ENTRESTO) 97-103 MG Take 1 tablet by mouth 2 (two) times daily.   sucralfate (CARAFATE) 1 g tablet Take 1 tablet (1 g total) by mouth 4 (four) times daily -  with meals and at bedtime for 10 days.   [DISCONTINUED] bisoprolol-hydrochlorothiazide (ZIAC) 5-6.25 MG tablet Take 1 tablet by mouth daily.   [DISCONTINUED] ENTRESTO 49-51 MG Take 1 tablet by mouth 2 (two) times daily.   [DISCONTINUED] Sacubitril-Valsartan (ENTRESTO) 15-16 MG CPSP Take by mouth.   [DISCONTINUED] spironolactone (ALDACTONE) 25 MG tablet Take 25 mg by mouth daily.   [DISCONTINUED] torsemide (DEMADEX) 20 MG tablet Take 40 mg by mouth daily.     Review of Systems:   Please see the history of present illness.    All other systems reviewed and are negative.     EKGs/Labs/Other Test Reviewed:   EKG: EKG performed November 2024 that I personally reviewed demonstrates sinus rhythm with nonspecific ST and T wave changes  EKG Interpretation Date/Time:    Ventricular Rate:    PR Interval:    QRS Duration:    QT Interval:    QTC Calculation:   R Axis:      Text Interpretation:           Risk Assessment/Calculations:          Physical Exam:   VS:  BP 128/80   Pulse 69   Ht 5\' 5"  (1.651 m)   Wt 287 lb 12.8 oz (130.5 kg)   SpO2 96%   BMI 47.89 kg/m        Wt Readings from Last 3 Encounters:  12/07/22 287 lb 12.8 oz (130.5 kg)  12/05/22 290 lb 6.4 oz (131.7 kg)  02/28/21 250 lb (113.4 kg)      GENERAL:  No apparent distress, AOx3 HEENT:  No carotid bruits, +2 carotid impulses, no scleral icterus CAR: RRR no murmurs, gallops, rubs, or thrills RES:  Clear to auscultation bilaterally ABD:   Soft, nontender, nondistended, positive bowel sounds x 4 VASC:  +2 radial pulses, +2 carotid pulses NEURO:  CN 2-12 grossly intact; motor and sensory grossly intact PSYCH:  No active depression or anxiety EXT:  No edema, ecchymosis, or cyanosis  Signed, Orbie Pyo, MD  12/07/2022 10:02 AM    [facility] [address] [place], [place], [place]  [address] Phone: [phone]; Fax: [phone]   Note:  This document was prepared using Dragon voice recognition software and may include unintentional  dictation errors.

## 2022-12-07 NOTE — Patient Instructions (Addendum)
Medication Instructions:  Your physician has recommended you make the following change in your medication:  1.) stop ZIAC (bisoprolol hydrochlorothiazide) 2.) start metoprolol succinate (Toprol XL) 25 mg --take HALF tablet daily at bedtime (12.5 mg) 3.) increase Entresto to 97-103 mg - take one tablet twice a day 4.) start Farxiga 10 mg - one tablet daily before breakfast  *If you need a refill on your cardiac medications before your next appointment, please call your pharmacy*   Lab Work: Please return in 7-10 days for blood work Designer, jewellery)  If you have labs (blood work) drawn today and your tests are completely normal, you will receive your results only by: [employer] (if you have MyChart) OR A paper copy in the mail If you have any lab test that is abnormal or we need to change your treatment, we will call you to review the results.   Testing/Procedures: Your physician has requested that you have an echocardiogram. Echocardiography is a painless test that uses sound waves to create images of your heart. It provides your doctor with information about the size and shape of your heart and how well your heart's chambers and valves are working. This procedure takes approximately one hour. There are no restrictions for this procedure. Please do NOT wear cologne, perfume, aftershave, or lotions (deodorant is allowed). Please arrive 15 minutes prior to your appointment time.  Please note: We ask at that you not bring children with you during ultrasound (echo/ vascular) testing. Due to room size and safety concerns, children are not allowed in the ultrasound rooms during exams. Our front office staff cannot provide observation of children in our lobby area while testing is being conducted. An adult accompanying a patient to their appointment will only be allowed in the ultrasound room at the discretion of the ultrasound technician under special circumstances. We apologize for any  inconvenience.    Follow-Up: At [facility], you and your health needs are our priority.  As part of our continuing mission to provide you with exceptional heart care, we have created designated Provider Care Teams.  These Care Teams include your primary Cardiologist (physician) and Advanced Practice Providers (APPs -  Physician Assistants and Nurse Practitioners) who all work together to provide you with the care you need, when you need it.  We recommend signing up for the patient portal called "MyChart".  Sign up information is provided on this After Visit Summary.  MyChart is used to connect with patients for Virtual Visits (Telemedicine).  Patients are able to view lab/test results, encounter notes, upcoming appointments, etc.  Non-urgent messages can be sent to your provider as well.   To learn more about what you can do with MyChart, go to [email or web address].    Your next appointment:   6 month(s)  Provider:   Orbie Pyo, MD

## 2022-12-17 ENCOUNTER — Ambulatory Visit: Payer: Commercial Managed Care - HMO | Attending: Internal Medicine

## 2022-12-17 ENCOUNTER — Other Ambulatory Visit: Payer: Self-pay | Admitting: Internal Medicine

## 2022-12-18 LAB — BASIC METABOLIC PANEL
BUN/Creatinine Ratio: 25 — ABNORMAL HIGH (ref 9–23)
BUN: 21 mg/dL (ref 6–24)
CO2: 23 mmol/L (ref 20–29)
Calcium: 9.5 mg/dL (ref 8.7–10.2)
Chloride: 102 mmol/L (ref 96–106)
Creatinine, Ser: 0.85 mg/dL (ref 0.57–1.00)
Glucose: 108 mg/dL — ABNORMAL HIGH (ref 70–99)
Potassium: 3.8 mmol/L (ref 3.5–5.2)
Sodium: 142 mmol/L (ref 134–144)
eGFR: 88 mL/min/{1.73_m2} (ref >59)

## 2022-12-24 ENCOUNTER — Other Ambulatory Visit: Payer: Self-pay | Admitting: Physician Assistant

## 2022-12-24 DIAGNOSIS — K219 Gastro-esophageal reflux disease without esophagitis: Secondary | ICD-10-CM

## 2022-12-25 ENCOUNTER — Other Ambulatory Visit: Payer: Self-pay | Admitting: Physician Assistant

## 2022-12-25 ENCOUNTER — Telehealth: Payer: Self-pay | Admitting: Physician Assistant

## 2022-12-25 DIAGNOSIS — K219 Gastro-esophageal reflux disease without esophagitis: Secondary | ICD-10-CM

## 2022-12-25 MED ORDER — SUCRALFATE 1 GM/10ML PO SUSP: 1.0000 g | Freq: Three times a day (TID) | ORAL | 0 refills | Status: DC

## 2022-12-25 NOTE — Telephone Encounter (Signed)
Called and let them know medication was sent in and unsure of coverage from insurance. Daughter verbalized understanding.  Gave them GI #, they will call and get scheduled.

## 2022-12-25 NOTE — Telephone Encounter (Signed)
Patient's a daughter called and requested for a refill to be sent in for sucralfate (CARAFATE) 1 g tablet to [facility] on [address].   However, the patient is wanting the medication in liquid form and not a tablet.   Please advise.

## 2023-01-01 ENCOUNTER — Ambulatory Visit: Payer: Commercial Managed Care - HMO | Admitting: Nurse Practitioner

## 2023-01-01 ENCOUNTER — Other Ambulatory Visit (INDEPENDENT_AMBULATORY_CARE_PROVIDER_SITE_OTHER): Payer: Commercial Managed Care - HMO

## 2023-01-01 ENCOUNTER — Encounter: Payer: Self-pay | Admitting: Nurse Practitioner

## 2023-01-01 VITALS — BP 118/70 | HR 72 | Ht 64.5 in | Wt 285.1 lb

## 2023-01-01 DIAGNOSIS — K2289 Other specified disease of esophagus: Secondary | ICD-10-CM | POA: Diagnosis not present

## 2023-01-01 DIAGNOSIS — K2100 Gastro-esophageal reflux disease with esophagitis, without bleeding: Secondary | ICD-10-CM

## 2023-01-01 DIAGNOSIS — N920 Excessive and frequent menstruation with regular cycle: Secondary | ICD-10-CM | POA: Diagnosis not present

## 2023-01-01 DIAGNOSIS — I5022 Chronic systolic (congestive) heart failure: Secondary | ICD-10-CM

## 2023-01-01 DIAGNOSIS — D509 Iron deficiency anemia, unspecified: Secondary | ICD-10-CM | POA: Diagnosis not present

## 2023-01-01 LAB — CBC
HCT: 31.6 % — ABNORMAL LOW (ref 36.0–46.0)
Hemoglobin: 10.2 g/dL — ABNORMAL LOW (ref 12.0–15.0)
MCHC: 32.2 g/dL (ref 30.0–36.0)
MCV: 81.8 fL (ref 78.0–100.0)
Platelets: 391 10*3/uL (ref 150.0–400.0)
RBC: 3.87 Mil/uL (ref 3.87–5.11)
RDW: 16.2 % — ABNORMAL HIGH (ref 11.5–15.5)
WBC: 11.4 10*3/uL — ABNORMAL HIGH (ref 4.0–10.5)

## 2023-01-01 MED ORDER — FERROUS SULFATE 325 (65 FE) MG PO TBEC: 325.0000 mg | DELAYED_RELEASE_TABLET | Freq: Every day | ORAL | 1 refills | Status: DC

## 2023-01-01 NOTE — Patient Instructions (Signed)
You have been scheduled for a Barium Esophogram at [facility] (1st floor of the hospital) on 01/07/23 at 11:00 am. Please arrive 30 minutes prior to your appointment for registration.If you need to reschedule for any reason, please contact radiology at [phone] to do so. __________________________________________________________________ Take Colace 100mg  at bedtime as needed for constipation _________________________________________________  We have sent over a referral for OBGYN. You may receive a call within the next few weeks regarding an appointment.  Due to recent changes in healthcare laws, you may see the results of your imaging and laboratory studies on MyChart before your provider has had a chance to review them.  We understand that in some cases there may be results that are confusing or concerning to you. Not all laboratory results come back in the same time frame and the provider may be waiting for multiple results in order to interpret others.  Please give [place] 48 hours in order for your provider to thoroughly review all the results before contacting the office for clarification of your results.   Thank you for trusting me with your gastrointestinal care!   Alcide Evener, CRNP

## 2023-01-01 NOTE — Progress Notes (Unsigned)
01/01/2023 Hatley Apley [id number] [birth year]   CHIEF COMPLAINT: Heartburn, pain in chest when eating   HISTORY OF PRESENT ILLNESS: Kaytelynn Faurot is a 42 year old female with a past medical history of hypertension, chronic systolic heart failure, IDA since 18 and GERD. Past C section x 3. She is known by Dr. Adela Lank. She presents today for further evaluation regarding GERD and pain in her chest/esophagus which occurs only when eating. She is accompanied by her daughter. She describes having frequent heartburn with associated pain in her chest/esophagus which occurs when eating most foods since 02/2022. No chest pain with activity or exertion. No SOB. No upper or lower abdominal pain. She is taking Pantoprazole 40mg  every day and Sucralfate 1gm po bid as prescribed by her PCP and her acid reflux has improved but she continues to have pain in her chest/esophagus when she eats. No dysphagia. No N/V or regurgitation. She underwent an EGD due to having acid reflux and dysphagia 10/05/2019 which showed evidence of reflux esophagitis and he esophagus was normal and was empirically dilated. She endorses having anemia since the age of 37. She has heavy menstrual bleeding with clots for 3 to 4 days with each menstrual cycle which occurs monthly. Labs done by her PCP 12/05/2022 showed a Hg level of 10.8, iron 30 and ferritin 11.9. She was instructed to start oral iron OTC which she has not done. She is passing a normal brown stool most days. No rectal bleeding or black stools. No NSAID use. No alcohol use. No known family history of esophageal, gastric or colorectal cancer. She has chronic systolic heart failure on Torsemide, Spironolactone and Entresto. She is scheduled for an ECHO 01/24/2023.      Latest Ref Rng & Units 12/05/2022    2:41 PM  CBC  WBC 4.0 - 10.5 K/uL 9.4   Hemoglobin 12.0 - 15.0 g/dL 25.3   Hematocrit 66.4 - 46.0 % 33.6   Platelets 150.0 - 400.0 K/uL 360.0        Latest Ref Rng &  Units 12/17/2022    1:04 PM 12/05/2022    2:41 PM  CMP  Glucose 70 - 99 mg/dL 403  86   BUN 6 - 24 mg/dL 21  14   Creatinine 4.74 - 1.00 mg/dL 2.59  5.63   Sodium 875 - 144 mmol/L 142  137   Potassium 3.5 - 5.2 mmol/L 3.8  3.8   Chloride 96 - 106 mmol/L 102  103   CO2 20 - 29 mmol/L 23  25   Calcium 8.7 - 10.2 mg/dL 9.5  9.1   Total Protein 6.0 - 8.3 g/dL  7.4   Total Bilirubin 0.2 - 1.2 mg/dL  0.5   Alkaline Phos 39 - 117 U/L  79   AST 0 - 37 U/L  15   ALT 0 - 35 U/L  14     Iron 30, saturation ration 6.9, TIBC 436.8 and ferritin 11.9.   GI PROCEDURES:  EGD 10/05/2019: - Esophagogastric landmarks identified.  - Normal esophagus otherwise  - Empiric dilation performed to 18mm and biopsies taken to rule out EoE  - Normal stomach.  - Normal duodenal bulb and second portion of the duodenum. Surgical [P], esophagus - ESOPHAGEAL SQUAMOUS MUCOSA WITH MILD VASCULAR CONGESTION, AND FOCAL SQUAMOUS BALLOONING, SUGGESTIVE OF REFLUX ESOPHAGITIS - NEGATIVE FOR INCREASED INTRAEPITHELIAL EOSINOPHILS  Prior EGDs with esophageal dilatation at [facility], records not available.   Past Medical History:  Diagnosis Date  GERD (gastroesophageal reflux disease)    Hypertension    Past Surgical History:  Procedure Laterality Date   CESAREAN SECTION     x3    Social History: Nonsmoker. No alcohol use. No drug use.   Family History: Father had a stroke. No known family history of colon cancer.    Allergies  Allergen Reactions   Other Other (See Comments)    Patient does not like to take medications with dyes per patient.      Outpatient Encounter Medications as of 01/01/2023  Medication Sig   dapagliflozin propanediol (FARXIGA) 10 MG TABS tablet Take 1 tablet (10 mg total) by mouth daily.   metoprolol succinate (TOPROL XL) 25 MG 24 hr tablet Take 0.5 tablets (12.5 mg total) by mouth at bedtime.   pantoprazole (PROTONIX) 40 MG tablet Take 1 tablet (40 mg total) by mouth daily.    sacubitril-valsartan (ENTRESTO) 97-103 MG Take 1 tablet by mouth 2 (two) times daily.   spironolactone (ALDACTONE) 25 MG tablet Take 1 tablet (25 mg total) by mouth daily.   sucralfate (CARAFATE) 1 GM/10ML suspension Take 10 mLs (1 g total) by mouth in the morning, at noon, and at bedtime.   torsemide (DEMADEX) 20 MG tablet Take 2 tablets (40 mg total) by mouth daily.   No facility-administered encounter medications on file as of 01/01/2023.     REVIEW OF SYSTEMS:  Gen: Denies fever, sweats or chills. No weight loss.  CV: Denies chest pain, palpitations or edema. Resp: Denies cough, shortness of breath of hemoptysis.  GI: Denies heartburn, dysphagia, stomach or lower abdominal pain. No diarrhea or constipation.  GU: Denies urinary burning, blood in urine, increased urinary frequency or incontinence. MS: Denies joint pain, muscles aches or weakness. Derm: Denies rash, itchiness, skin lesions or unhealing ulcers. Psych: Denies depression, anxiety, memory loss or confusion. Heme: Denies bruising, easy bleeding. Neuro:  Denies headaches, dizziness or paresthesias. Endo:  Denies any problems with DM, thyroid or adrenal function.  PHYSICAL EXAM:  BP 118/70 (BP Location: Left Wrist, Patient Position: Sitting, Cuff Size: Normal)   Pulse 72   Ht 5' 4.5" (1.638 m)   Wt 285 lb 2 oz (129.3 kg)   BMI 48.19 kg/m   Wt Readings from Last 3 Encounters:  01/01/23 285 lb 2 oz (129.3 kg)  12/07/22 287 lb 12.8 oz (130.5 kg)  12/05/22 290 lb 6.4 oz (131.7 kg)    General: Obese 42 year old female in no acute distress. Head: Normocephalic and atraumatic. Eyes:  Sclerae non-icteric, conjunctive pink. Ears: Normal auditory acuity. Mouth: Dentition intact. No ulcers or lesions.  Neck: Supple, no lymphadenopathy or thyromegaly.  Lungs: Clear bilaterally to auscultation without wheezes, crackles or rhonchi. Heart: Regular rate and rhythm. No murmur, rub or gallop appreciated.  Abdomen: Soft,  nontender, nondistended. No masses. No hepatosplenomegaly. Normoactive bowel sounds x 4 quadrants.  Rectal: Deferred. Musculoskeletal: Symmetrical with no gross deformities. Skin: Warm and dry. No rash or lesions on visible extremities. Extremities: No edema. Neurological: Alert oriented x 4, no focal deficits.  Psychological:  Alert and cooperative. Normal mood and affect.  ASSESSMENT AND PLAN:  42 year old female with a history of GERD with active reflux and chest/esophageal pain which occurs only when eating since 02/2022. EGD in 2021 showed evidence of mild reflux esophagitis.  -Barium swallow study with tablet  -EGD to be scheduled after ECHO completed -GERD diet  -Continue Pantoprazole 40mg  every day -Continue Carafate 1gm po  bid not to take  within 2 to 4 hours of any other medication   Iron deficiency anemia, chronic. Menorrhagia a contributing factor. Rule out GI etiology.  -CBC -EGD +/- colonoscopy at [facility] (BMI 48) after ECHO completed, benefits and risks discussed including risk with sedation, risk of bleeding, perforation and infection  -Refer to gyn  -Ferrous Sulfate 325mg  one tab po every day, patient informed oral iron may result in green or black stools -Colace 100mg  one capsule po every day if Ferrous Sulfate causes constipation   Menorrhagia  -Refer to gyn as noted above   Chronic systolic heart failure. On Torsemide, Spironolactone and Entresto. Scheduled for an ECHO 01/24/2023.    CC:  Alfredia Ferguson, PA-C

## 2023-01-03 ENCOUNTER — Telehealth: Payer: Self-pay

## 2023-01-03 ENCOUNTER — Other Ambulatory Visit: Payer: Self-pay

## 2023-01-03 DIAGNOSIS — K219 Gastro-esophageal reflux disease without esophagitis: Secondary | ICD-10-CM

## 2023-01-03 MED ORDER — PANTOPRAZOLE SODIUM 40 MG PO TBEC: DELAYED_RELEASE_TABLET | ORAL | 2 refills | Status: DC

## 2023-01-03 NOTE — Progress Notes (Signed)
Anna Chapman, pls inform patient to increase Pantoprazole 40mg  po bid to be taken 30 minutes before breakfast and dinner #60 with 2 additional refills.  I will update Dr. Adela Lank once echo results received.

## 2023-01-03 NOTE — Progress Notes (Signed)
Agree with assessment and plan as outlined.  Given her age and iron deficiency I do think EGD and colonoscopy is appropriate for her. We will await her echocardiogram.  If her EF looks okay then we can proceed with this procedure at the [facility] as long as BMI is less than 50.  In the interim I recommend she increase her Protonix to 40 mg twice daily given her symptoms.  Thanks  Jill Side can keep you posted on results of her echocardiogram.

## 2023-01-03 NOTE — Telephone Encounter (Signed)
Author: Arnaldo Natal, NP Service: Gastroenterology Author Type: Nurse Practitioner  Filed: 01/03/2023 10:31 AM Encounter Date: 01/01/2023 Status: Signed  Editor: Arnaldo Natal, NP (Nurse Practitioner)   Viviann Spare, pls inform patient to increase Pantoprazole 40mg  po bid to be taken 30 minutes before breakfast and dinner #60 with 2 additional refills.  I will update Dr. Adela Lank once echo results received.     Called & spoke with patient regarding NP/MD recommendations. Prescription refills sent to pharmacy, pt had no further questions.

## 2023-01-07 ENCOUNTER — Ambulatory Visit (HOSPITAL_COMMUNITY)
Admission: RE | Admit: 2023-01-07 | Discharge: 2023-01-07 | Disposition: A | Discharge status: Home / Self Care | Payer: Commercial Managed Care - HMO | Source: Ambulatory Visit | Attending: Nurse Practitioner | Admitting: Nurse Practitioner

## 2023-01-07 DIAGNOSIS — K2289 Other specified disease of esophagus: Secondary | ICD-10-CM | POA: Insufficient documentation

## 2023-01-07 DIAGNOSIS — D509 Iron deficiency anemia, unspecified: Secondary | ICD-10-CM | POA: Diagnosis present

## 2023-01-15 ENCOUNTER — Ambulatory Visit: Payer: Commercial Managed Care - HMO | Admitting: Physician Assistant

## 2023-01-15 ENCOUNTER — Encounter: Payer: Self-pay | Admitting: Physician Assistant

## 2023-01-15 VITALS — BP 110/77 | HR 72 | Temp 98.3°F | Ht 64.5 in | Wt 284.0 lb

## 2023-01-15 DIAGNOSIS — M25512 Pain in left shoulder: Secondary | ICD-10-CM

## 2023-01-15 DIAGNOSIS — M25511 Pain in right shoulder: Secondary | ICD-10-CM | POA: Diagnosis not present

## 2023-01-15 DIAGNOSIS — Z6841 Body Mass Index (BMI) 40.0 and over, adult: Secondary | ICD-10-CM

## 2023-01-15 DIAGNOSIS — M25551 Pain in right hip: Secondary | ICD-10-CM | POA: Diagnosis not present

## 2023-01-15 DIAGNOSIS — M25552 Pain in left hip: Secondary | ICD-10-CM | POA: Diagnosis not present

## 2023-01-15 DIAGNOSIS — K5900 Constipation, unspecified: Secondary | ICD-10-CM

## 2023-01-15 LAB — COMPREHENSIVE METABOLIC PANEL
ALT: 11 U/L (ref 0–35)
AST: 12 U/L (ref 0–37)
Albumin: 4.1 g/dL (ref 3.5–5.2)
Alkaline Phosphatase: 72 U/L (ref 39–117)
BUN: 26 mg/dL — ABNORMAL HIGH (ref 6–23)
CO2: 25 meq/L (ref 19–32)
Calcium: 9.6 mg/dL (ref 8.4–10.5)
Chloride: 100 meq/L (ref 96–112)
Creatinine, Ser: 1 mg/dL (ref 0.40–1.20)
GFR: 69.68 mL/min (ref >60.00)
Glucose, Bld: 98 mg/dL (ref 70–99)
Potassium: 3.8 meq/L (ref 3.5–5.1)
Sodium: 136 meq/L (ref 135–145)
Total Bilirubin: 0.6 mg/dL (ref 0.2–1.2)
Total Protein: 7.6 g/dL (ref 6.0–8.3)

## 2023-01-15 LAB — SEDIMENTATION RATE: Sed Rate: 85 mm/h — ABNORMAL HIGH (ref 0–20)

## 2023-01-15 LAB — CK: Total CK: 47 U/L (ref 7–177)

## 2023-01-15 LAB — C-REACTIVE PROTEIN: CRP: 3.5 mg/dL (ref 0.5–20.0)

## 2023-01-15 MED ORDER — ZEPBOUND 2.5 MG/0.5ML ~~LOC~~ SOAJ: 2.5000 mg | SUBCUTANEOUS | 0 refills | Status: DC

## 2023-01-15 MED ORDER — DOCUSATE SODIUM 50 MG PO CAPS: 50.0000 mg | ORAL_CAPSULE | Freq: Two times a day (BID) | ORAL | 0 refills | Status: DC

## 2023-01-15 NOTE — Progress Notes (Signed)
 Established patient visit   Patient: Anna Chapman   DOB: 02/28/1980   42 y.o. Female  MRN: [id number] Visit Date: 01/15/2023  Today's healthcare provider: Manuelita Flatness, PA-C   Cc. Shoulder, hip pain  Subjective     Discussed the use of AI scribe software for clinical note transcription with the patient, who gave verbal consent to proceed.  History of Present Illness   The patient presents with bilateral shoulder pain, hip pain, and lower back pain that started about three weeks ago. The pain in the shoulders is present at rest and worsens with movement, particularly when raising the arms above the head or moving them behind the back. The hip pain is more noticeable when lying down and upon waking up. The patient also reports lower back pain. The patient denies any recent injury or trauma. She reports full ROM of her upper and lower extremities, but they are painful.  In addition to the joint pain, the patient reports pain during bowel movements. The patient describes it as a feeling of pressure and soreness, and has difficulty passing stool. The patient has been taking iron supplements for anemia, and the onset of the bowel symptoms coincides with the start of the iron supplementation.       Medications: Outpatient Medications Prior to Visit  Medication Sig   bisoprolol-hydrochlorothiazide (ZIAC) 5-6.25 MG tablet Take 1 tablet by mouth daily.   dapagliflozin  propanediol (FARXIGA ) 10 MG TABS tablet Take 1 tablet (10 mg total) by mouth daily.   ferrous sulfate  325 (65 FE) MG EC tablet Take 1 tablet (325 mg total) by mouth daily.   metoprolol  succinate (TOPROL  XL) 25 MG 24 hr tablet Take 0.5 tablets (12.5 mg total) by mouth at bedtime.   pantoprazole  (PROTONIX ) 40 MG tablet Take 1 tablet by mouth twice daily, 30 minutes before breakfast and dinner.   sacubitril -valsartan  (ENTRESTO ) 97-103 MG Take 1 tablet by mouth 2 (two) times daily.   spironolactone  (ALDACTONE ) 25 MG tablet Take  1 tablet (25 mg total) by mouth daily.   sucralfate  (CARAFATE ) 1 GM/10ML suspension Take 10 mLs (1 g total) by mouth in the morning, at noon, and at bedtime.   torsemide  (DEMADEX ) 20 MG tablet Take 2 tablets (40 mg total) by mouth daily.   No facility-administered medications prior to visit.    Review of Systems  Constitutional:  Negative for fatigue and fever.  Respiratory:  Negative for cough and shortness of breath.   Cardiovascular:  Negative for chest pain and leg swelling.  Gastrointestinal:  Positive for constipation. Negative for abdominal pain.  Neurological:  Negative for dizziness and headaches.       Objective    BP 110/77   Pulse 72   Temp 98.3 F (36.8 C) (Oral)   Ht 5' 4.5 (1.638 m)   Wt 284 lb (128.8 kg)   SpO2 94%   BMI 48.00 kg/m    Physical Exam Vitals reviewed.  Constitutional:      Appearance: She is not ill-appearing.  HENT:     Head: Normocephalic.  Eyes:     Conjunctiva/sclera: Conjunctivae normal.  Cardiovascular:     Rate and Rhythm: Normal rate.  Pulmonary:     Effort: Pulmonary effort is normal. No respiratory distress.  Musculoskeletal:     Comments: Full ROM bilateral shoulders Some b/l trap tension  Neurological:     General: No focal deficit present.     Mental Status: She is alert and oriented to person,  place, and time.  Psychiatric:        Mood and Affect: Mood normal.        Behavior: Behavior normal.      No results found for any visits on 01/15/23.  Assessment & Plan    Acute pain of both shoulders Bilateral hip pain Will order labs, may be 2/2 to inactivity. If labs wnr, would refer to PT -     Comprehensive metabolic panel -     CK -     Sedimentation rate -     C-reactive protein  Constipation, unspecified constipation type 2/2 iron supplementation Rx stool softener, recommending increasing fluid intake -     Docusate Sodium ; Take 1 capsule (50 mg total) by mouth 2 (two) times daily.  Dispense: 10 capsule;  Refill: 0  Morbid obesity (HCC) Discussed zepbound  for weight management. Explained administration, potential side effects (nausea, acid reflux), and goal of weight loss for overall health improvement. Emphasized medication is not for lifetime use and importance of insurance coverage due to high cost. Pt is hesitant to give herself injections, suggested she make an appt with us  for her first dose -     Zepbound ; Inject 2.5 mg into the skin once a week. For four weeks  Dispense: 2 mL; Refill: 0   Return in about 3 months (around 04/15/2023) for weight Management.       Manuelita Flatness, PA-C  [facility] at [facility] [phone] (phone) [phone] (fax)  [facility]

## 2023-01-15 NOTE — Assessment & Plan Note (Signed)
 Discussed zepbound  for weight management. Explained administration, potential side effects (nausea, acid reflux), and goal of weight loss for overall health improvement. Emphasized medication is not for lifetime use and importance of insurance coverage due to high cost. Pt is hesitant to give herself injections, suggested she make an appt with us  for her first dose

## 2023-01-17 ENCOUNTER — Other Ambulatory Visit: Payer: Self-pay | Admitting: Physician Assistant

## 2023-01-17 ENCOUNTER — Telehealth: Payer: Self-pay

## 2023-01-17 DIAGNOSIS — M25552 Pain in left hip: Secondary | ICD-10-CM

## 2023-01-17 DIAGNOSIS — M25512 Pain in left shoulder: Secondary | ICD-10-CM

## 2023-01-17 NOTE — Telephone Encounter (Signed)
 Spoke with patient today. She stated that insurance is not covering the Zepbound  Is there anything else she can try?

## 2023-01-22 ENCOUNTER — Other Ambulatory Visit: Payer: Self-pay | Admitting: Physician Assistant

## 2023-01-22 DIAGNOSIS — I509 Heart failure, unspecified: Secondary | ICD-10-CM

## 2023-01-23 NOTE — Telephone Encounter (Signed)
 Called and left detailed message for patient letting her know that a referral was placed for [facility].  If she has any questions to please give [place] a call.

## 2023-01-24 ENCOUNTER — Ambulatory Visit (HOSPITAL_COMMUNITY): Payer: Commercial Managed Care - HMO | Attending: Cardiology

## 2023-01-24 DIAGNOSIS — I5022 Chronic systolic (congestive) heart failure: Secondary | ICD-10-CM | POA: Diagnosis present

## 2023-01-24 LAB — ECHOCARDIOGRAM COMPLETE
Area-P 1/2: 3.34 cm2
S' Lateral: 3.2 cm

## 2023-02-05 ENCOUNTER — Encounter: Payer: Commercial Managed Care - HMO | Admitting: Nurse Practitioner

## 2023-02-05 ENCOUNTER — Other Ambulatory Visit: Payer: Self-pay | Admitting: Physician Assistant

## 2023-02-05 ENCOUNTER — Telehealth: Payer: Self-pay | Admitting: Internal Medicine

## 2023-02-05 DIAGNOSIS — Z01812 Encounter for preprocedural laboratory examination: Secondary | ICD-10-CM

## 2023-02-05 DIAGNOSIS — K219 Gastro-esophageal reflux disease without esophagitis: Secondary | ICD-10-CM

## 2023-02-05 NOTE — Telephone Encounter (Signed)
Patient was returning call for results. Please advise °

## 2023-02-05 NOTE — Telephone Encounter (Signed)
 Spoke with patient and she is aware of echo results. She verbalized understanding

## 2023-02-08 NOTE — Telephone Encounter (Signed)
Anna Pyo, MD 01/25/2023  7:15 PM EST     EF still abnormal please set up for coronary angiography and right heart catheterization re: Characterize cardiomyopathy      Called and spoke with the patient and her son.  Scheduled right and left heart cath for Friday 02/22/23, 9:00 with Dr. Lynnette Caffey.  Reviewed these details and location with patient and her son.  Aware I am sending the complete instructions via the MyChart.  They will let us know if any questions.    Labs at [facility].  EKG at cath lab holding.  H&P to be updated by Dr. Lynnette Caffey day of procedure.

## 2023-02-12 ENCOUNTER — Ambulatory Visit: Payer: Commercial Managed Care - HMO | Admitting: Cardiology

## 2023-02-15 ENCOUNTER — Encounter: Payer: Self-pay | Admitting: Internal Medicine

## 2023-02-15 LAB — CBC
Hematocrit: 34.4 % (ref 34.0–46.6)
Hemoglobin: 11 g/dL — ABNORMAL LOW (ref 11.1–15.9)
MCH: 27.8 pg (ref 26.6–33.0)
MCHC: 32 g/dL (ref 31.5–35.7)
MCV: 87 fL (ref 79–97)
Platelets: 384 10*3/uL (ref 150–450)
RBC: 3.96 x10E6/uL (ref 3.77–5.28)
RDW: 16.1 % — ABNORMAL HIGH (ref 11.7–15.4)
WBC: 8.3 10*3/uL (ref 3.4–10.8)

## 2023-02-15 LAB — BASIC METABOLIC PANEL
BUN/Creatinine Ratio: 27 — ABNORMAL HIGH (ref 9–23)
BUN: 31 mg/dL — ABNORMAL HIGH (ref 6–24)
CO2: 21 mmol/L (ref 20–29)
Calcium: 9.4 mg/dL (ref 8.7–10.2)
Chloride: 100 mmol/L (ref 96–106)
Creatinine, Ser: 1.16 mg/dL — ABNORMAL HIGH (ref 0.57–1.00)
Glucose: 113 mg/dL — ABNORMAL HIGH (ref 70–99)
Potassium: 3.6 mmol/L (ref 3.5–5.2)
Sodium: 139 mmol/L (ref 134–144)
eGFR: 60 mL/min/{1.73_m2} (ref >59)

## 2023-02-19 ENCOUNTER — Telehealth: Payer: Self-pay | Admitting: *Deleted

## 2023-02-19 DIAGNOSIS — I5022 Chronic systolic (congestive) heart failure: Secondary | ICD-10-CM

## 2023-02-19 NOTE — Telephone Encounter (Signed)
 ----- Message from Anna Chapman DEL sent at 02/19/2023 12:35 PM EST ----- Regarding: RE: 02/07 Good afternoon,  The denial for the cath is upheld, we will need to cancel for 2/7. Anna Chapman mentioned a CCTA if we couldn't get the cath approved, auth is not required for the CCTA.  Thank you.   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  If we can't get it, please get a coronary CTA ----- Message ----- From: [name] Sent: 02/15/2023  11:27 AM EST To: Anna Blush, RN; Anna Chapman Thukkani, MD Subject: RE: 02/07                                      Good morning,  Anna Chapman has denied the request for the L/R Heart Cath. The insurance is requesting a stress test prior to approval. Below is the rationale. I have appealed and the insurance company has upheld the denial.  Thank you,  Anna Chapman   Why: We reviewed information from Dr. Lurena Chapman, your benefit plan, and any policies and guidelines needed to reach this decision.  We found the service requested is not medically necessary in your case:  Your doctor told us  that there is a concern related to your heart. The request cannot be approved because:  Further imaging can be done if you have one of these results. -Myocardial perfusion imaging (MPI) results show at least ten percent of your heart muscle may not be getting enough blood. MPI is a type of stress test that looks at the function of your heart muscle. -Stress echocardiogram (ECHO) results show at least three segments where the test caused decreased oxygen in your heart's blood. Stress ECHO is a test that uses sound waves to assess your heart's function when it is beating fast. -An exercise treadmill test caused a change on a tracing of your  heart's actions that is not normal. This would be as further described in this guideline. -One of the other stress test findings listed in this guideline. The records sent to us  do not show one of these results.  Imaging can be done if you still have symptoms after a 12 week trial of treatment. This trial should consist of all of these treatments. -A treatment that stops blood cells from forming a blood clot. -A treatment that helps lower your cholesterol levels. -A drug that treats and helps prevent chest pain due to decreased oxygen in the blood in your heart muscle. This is done by working towards a goal heart rate of 60 beats per minute or less. -A drug that treats and helps prevent high blood pressure. This is done by working towards a goal blood pressure of 140/90 mmHg (millimeters of Mercury). The notes sent to us  do not show you had all of these treatments.  Imaging can be done if you have heart symptoms at a low level of exercise or at rest despite treatment with the highest doses of the best treatments you can handle. The notes sent to us   do not show you have these symptoms.  Further imaging can be done if the results of a CT of the vessels that supply blood to your heart show one of these findings. -You have at least 40 percent narrowing of the main blood vessel that supplies blood to your left lower heart chamber. -There is at least 70 percent narrowing of first half of the blood vessel that supplies blood to the front of the left side of your heart. -At least 60 percent narrowing of the first half of two or more blood vessels that supply blood For purposes of this policy, Anna Chapman refers to all [employer] products and services are provided exclusively by or through operating subsidiaries of The [employer] Group. Such operating subsidiaries include Occupational [employer]ologist and [employer], [place]  [employer], [employer], [name], [name], [employer], [place], [employer], [name], subsidiaries of [employer], [place] and HMO or service company subsidiaries of [employer] and [employer], [name] Please refer to your ID card for the subsidiary that insures or administers your benefit plan. [name] and [name], Inc., [name], Inc., [name], [name], Inc, [name] are licensed or certified utilization review entities. The [employer] name, logo, and other Anna Chapman marks are owned by [employer], [name] to your heart is present. -You have at least 50 percent narrowing of the first half of three or more blood vessels that supply blood to your heart. -One of the other results listed in this guideline. The records sent to us  do not show you have one of these findings.  Imaging can be done for one of these reasons. -You have impaired function of your left lower heart chamber that is new or has worsened. -You have new or worsening congestive heart failure. This is when your heart does not pump as well as it should. -Your lower heart chambers do not pump out blood due to a heart rhythm problem. -You have a fast heart rate that comes from the bottom of your heart that is not normal. The notes do not describe any of these reasons.  Notes from your doctor must show you have pain, pressure or discomfort in your chest, upper belly, shoulder, arm or jaw that occurs with stress (physical or mental). These symptoms must be new, or have returned, persisted, or worsened. The notes must also show these symptoms improved with rest and/or drugs (nitroglycerin ). The notes sent to us  do not show this applies to you. We told your doctor about this. Please talk to your doctor if you have questions ----- Message ----- From: Anna Chapman Sent: 02/08/2023   3:13 PM EST To: Anna Chapman Subject: 02/07                                            ----- Message ----- From: Anna Petri, RN Sent: 02/08/2023   1:21 PM EST To: Anna CHRISTELLA Lipps, RN; Anna Chapman Red, MD; # Subject: right and left heart cath 02/22/23 - Dr. Gerhardt  Right and left [facility] 02/22/23 Cardiomyopathy Dr. Red Labs at [facility] next week. EKG at cath lab holding.  H&P to be updated by Dr. Red day of procedure.   Thank you! Liliani Bobo

## 2023-02-19 NOTE — Telephone Encounter (Signed)
 Called cath lab and cancelled right and left heart cath for Friday 02/22/23.  Called patient and spoke w her and her daughter.  Explained that we will be ordering a cCTA and I will send the instructions through to the portal.  They will let us  know if they have any questions.

## 2023-02-20 LAB — LAB REPORT - SCANNED: EGFR: 56

## 2023-02-20 NOTE — Progress Notes (Signed)
 Established patient visit   Patient: Anna Chapman   DOB: 04-Nov-1980   43 y.o. Female  MRN: [id number] Visit Date: 02/21/2023  Today's healthcare provider: Manuelita Flatness, PA-C  Cc. ED f/u  Subjective     Patient was seen in the emergency room 2/4/5, diagnosed with an AKI.  She presented with abdominal pain shortness of breath cough.  During workup cardiac negative, CT glucosuria, mild anemia, creatinine was 1.24 and GFR is 50.  Upper respiratory testing was negative.  CT abdomen pelvis identified and meter pulmonary nodule right upper lobe, umbilical hernia.  Chest x-ray clear.  Today, pt reports continued epigastric pain/burning sensation. She is out of carafate , and has been taking the pantoprazole  only sporadically.  She reports intermittent blurred vision and headache for the last 5 or so days. Denies nasal congestion or ear pain.  Medications: Outpatient Medications Prior to Visit  Medication Sig   bisoprolol-hydrochlorothiazide (ZIAC) 5-6.25 MG tablet Take 1 tablet by mouth daily.   dapagliflozin  propanediol (FARXIGA ) 10 MG TABS tablet Take 1 tablet (10 mg total) by mouth daily.   docusate sodium  (COLACE) 50 MG capsule Take 1 capsule (50 mg total) by mouth 2 (two) times daily.   ferrous sulfate  325 (65 FE) MG EC tablet Take 1 tablet (325 mg total) by mouth daily.   metoprolol  succinate (TOPROL  XL) 25 MG 24 hr tablet Take 0.5 tablets (12.5 mg total) by mouth at bedtime.   pantoprazole  (PROTONIX ) 40 MG tablet Take 1 tablet by mouth twice daily, 30 minutes before breakfast and dinner. (Patient taking differently: Take 40 mg by mouth daily as needed (Heatburn). 30 minutes before breakfast and dinner.)   sacubitril -valsartan  (ENTRESTO ) 97-103 MG Take 1 tablet by mouth 2 (two) times daily.   spironolactone  (ALDACTONE ) 25 MG tablet Take 1 tablet (25 mg total) by mouth daily.   sucralfate  (CARAFATE ) 1 GM/10ML suspension Take 10 mLs (1 g total) by mouth in the morning, at noon, and at  bedtime. (Patient taking differently: Take 1 g by mouth 4 (four) times daily -  with meals and at bedtime.)   tirzepatide  (ZEPBOUND ) 2.5 MG/0.5ML Pen Inject 2.5 mg into the skin once a week. For four weeks   torsemide  (DEMADEX ) 20 MG tablet Take 2 tablets (40 mg total) by mouth daily.   No facility-administered medications prior to visit.    Review of Systems  Constitutional:  Negative for fatigue and fever.  Eyes:  Positive for visual disturbance.  Respiratory:  Negative for cough and shortness of breath.   Cardiovascular:  Negative for chest pain and leg swelling.  Gastrointestinal:  Negative for abdominal pain.  Neurological:  Positive for headaches. Negative for dizziness.       Objective    BP 114/82   Pulse 67   Temp 97.8 F (36.6 C) (Oral)   Ht 5' 4.5 (1.638 m)   Wt 285 lb 2 oz (129.3 kg)   SpO2 96%   BMI 48.19 kg/m    Physical Exam Vitals reviewed.  Constitutional:      Appearance: She is not ill-appearing.  HENT:     Head: Normocephalic.     Right Ear: Tympanic membrane normal.     Left Ear: Tympanic membrane normal.  Eyes:     Conjunctiva/sclera: Conjunctivae normal.  Cardiovascular:     Rate and Rhythm: Normal rate.  Pulmonary:     Effort: Pulmonary effort is normal. No respiratory distress.  Neurological:     General: No focal deficit  present.     Mental Status: She is alert and oriented to person, place, and time.  Psychiatric:        Mood and Affect: Mood normal.        Behavior: Behavior normal.     No results found for any visits on 02/21/23.  Assessment & Plan    Glucosuria Seen in ED. Last A1c was 6.1%. Given glucosuria, hyperglycemia and blurred vision, will repeat A1c today. -     Hemoglobin A1c  Lung nodule 9 mm incidentally seen in abd/pelvic CT Recommending CT chest to further characterize -     CT CHEST LCS NODULE F/U LOW DOSE WO CONTRAST; Future  AKI (acute kidney injury) (HCC) Emphasized importance of hydration, especially  with current antihypertensive medications. - Recheck kidney function with blood work today - Chartered loss adjuster intake of at least 64 ounces daily -     CBC with Differential/Platelet -     Comprehensive metabolic panel  GERD Burning chest pain managed intermittently with heartburn medication. Advised consistent medication use for at least two months to reduce acid levels and improve symptoms. - Take heartburn medication twice daily for at least two months - Reassess symptoms after two months of consistent medication use   Return will f/u pending labs.      Manuelita Flatness, PA-C  [facility] [phone] (phone) [phone] (fax)  [facility]

## 2023-02-21 ENCOUNTER — Telehealth: Payer: Self-pay

## 2023-02-21 ENCOUNTER — Encounter: Payer: Self-pay | Admitting: Physician Assistant

## 2023-02-21 ENCOUNTER — Ambulatory Visit: Payer: Commercial Managed Care - HMO | Admitting: Physician Assistant

## 2023-02-21 VITALS — BP 114/82 | HR 67 | Temp 97.8°F | Ht 64.5 in | Wt 285.1 lb

## 2023-02-21 DIAGNOSIS — R81 Glycosuria: Secondary | ICD-10-CM

## 2023-02-21 DIAGNOSIS — N179 Acute kidney failure, unspecified: Secondary | ICD-10-CM

## 2023-02-21 DIAGNOSIS — R911 Solitary pulmonary nodule: Secondary | ICD-10-CM

## 2023-02-21 DIAGNOSIS — K219 Gastro-esophageal reflux disease without esophagitis: Secondary | ICD-10-CM

## 2023-02-21 LAB — CBC WITH DIFFERENTIAL/PLATELET
Basophils Absolute: 0.1 10*3/uL (ref 0.0–0.1)
Basophils Relative: 0.7 % (ref 0.0–3.0)
Eosinophils Absolute: 0.3 10*3/uL (ref 0.0–0.7)
Eosinophils Relative: 3.7 % (ref 0.0–5.0)
HCT: 33.4 % — ABNORMAL LOW (ref 36.0–46.0)
Hemoglobin: 11 g/dL — ABNORMAL LOW (ref 12.0–15.0)
Lymphocytes Relative: 24.2 % (ref 12.0–46.0)
Lymphs Abs: 2.1 10*3/uL (ref 0.7–4.0)
MCHC: 32.8 g/dL (ref 30.0–36.0)
MCV: 86.1 fL (ref 78.0–100.0)
Monocytes Absolute: 0.8 10*3/uL (ref 0.1–1.0)
Monocytes Relative: 9.9 % (ref 3.0–12.0)
Neutro Abs: 5.3 10*3/uL (ref 1.4–7.7)
Neutrophils Relative %: 61.5 % (ref 43.0–77.0)
Platelets: 370 10*3/uL (ref 150.0–400.0)
RBC: 3.88 Mil/uL (ref 3.87–5.11)
RDW: 17.5 % — ABNORMAL HIGH (ref 11.5–15.5)
WBC: 8.6 10*3/uL (ref 4.0–10.5)

## 2023-02-21 LAB — COMPREHENSIVE METABOLIC PANEL
ALT: 15 U/L (ref 0–35)
AST: 14 U/L (ref 0–37)
Albumin: 3.7 g/dL (ref 3.5–5.2)
Alkaline Phosphatase: 72 U/L (ref 39–117)
BUN: 27 mg/dL — ABNORMAL HIGH (ref 6–23)
CO2: 28 meq/L (ref 19–32)
Calcium: 9.8 mg/dL (ref 8.4–10.5)
Chloride: 95 meq/L — ABNORMAL LOW (ref 96–112)
Creatinine, Ser: 1.11 mg/dL (ref 0.40–1.20)
GFR: 61.43 mL/min (ref >60.00)
Glucose, Bld: 124 mg/dL — ABNORMAL HIGH (ref 70–99)
Potassium: 3.4 meq/L — ABNORMAL LOW (ref 3.5–5.1)
Sodium: 136 meq/L (ref 135–145)
Total Bilirubin: 0.4 mg/dL (ref 0.2–1.2)
Total Protein: 7.1 g/dL (ref 6.0–8.3)

## 2023-02-21 LAB — HEMOGLOBIN A1C: Hgb A1c MFr Bld: 6.4 % (ref 4.6–6.5)

## 2023-02-21 NOTE — Telephone Encounter (Signed)
 Pt seen today

## 2023-02-21 NOTE — Telephone Encounter (Signed)
 Initial Comment Caller was transferred to speak with nurse before her appt next Tuesday, Patient has heart pain, stomach pains and headache. Translation No Nurse Assessment Nurse: Gaylene, RN, Alice Date/Time (Eastern Time): 02/20/2023 2:32:17 PM Confirm and document reason for call. If symptomatic, describe symptoms. ---caller states pt has heart pain, stomach pain, eye pain and headache, sx come and go. sx started 5 days ago. heart pain is the worst right now. Does the patient have any new or worsening symptoms? ---Yes Will a triage be completed? ---Yes Related visit to physician within the last 2 weeks? ---No Does the PT have any chronic conditions? (i.e. diabetes, asthma, this includes High risk factors for pregnancy, etc.) ---Yes List chronic conditions. ---high blood pressure Is the patient pregnant or possibly pregnant? (Ask all females between the ages of 69-55) ---No Is this a behavioral health or substance abuse call? ---No Guidelines Guideline Title Affirmed Question Affirmed Notes Nurse Date/Time (Eastern Time) Chest Pain SEVERE difficulty breathing (e.g., struggling for each breath, speaks in single words) Gaylene, RN, Alice 02/20/2023 2:34:16 PM PLEASE NOTE: All timestamps contained within this report are represented as Eastern Standard Time. CONFIDENTIALTY NOTICE: This fax transmission is intended only for the addressee. It contains information that is legally privileged, confidential or otherwise protected from use or disclosure. If you are not the intended recipient, you are strictly prohibited from reviewing, disclosing, copying using or disseminating any of this information or taking any action in reliance on or regarding this information. If you have received this fax in error, please notify us  immediately by telephone so that we can arrange for its return to us . Phone: [phone], Toll-Free: [phone], Fax: [phone] Page: 2 of 2 Call Id: [id number] Disp.  Time Titus Time) Disposition Final User 02/20/2023 2:30:25 PM Send to Urgent Vallery Arloa Pan 02/20/2023 2:37:54 PM Call EMS 911 Now Yes MARIACLARA, SPEAR 02/20/2023 2:38:34 PM 911 Outcome Documentation Gaylene, RN, Bergeron Reason: caller refused Final Disposition 02/20/2023 2:37:54 PM Call EMS 911 Now Yes Gaylene, RN, Bergeron [place] Disagree/Comply Disagree Caller Understands Yes PreDisposition InappropriateToAsk Care Advice Given Per Guideline CALL EMS 911 NOW: * Immediate medical attention is needed. You need to hang up and call 911 (or an ambulance). CARE ADVICE given per Chest Pain (Adult) guideline. Comments User: Bergeron Gaylene, RN Date/Time Titus Time): 02/20/2023 2:35:48 PM pt was in the hospital yesterday User: Bergeron Gaylene, RN Date/Time Titus Time): 02/20/2023 2:38:19 PM caller requesting a follow up appt per hospital recommendation

## 2023-02-21 NOTE — Addendum Note (Signed)
 Addended byTrenton Frock on: 02/21/2023 05:02 PM   Modules accepted: Orders

## 2023-02-22 ENCOUNTER — Encounter (HOSPITAL_COMMUNITY): Admission: RE | Payer: Commercial Managed Care - HMO | Source: Home / Self Care

## 2023-02-22 ENCOUNTER — Ambulatory Visit (HOSPITAL_COMMUNITY)
Admission: RE | Admit: 2023-02-22 | Payer: Commercial Managed Care - HMO | Source: Home / Self Care | Admitting: Internal Medicine

## 2023-02-22 SURGERY — RIGHT/LEFT HEART CATH AND CORONARY ANGIOGRAPHY
Anesthesia: LOCAL

## 2023-02-22 NOTE — Telephone Encounter (Signed)
 Order for cCTA placed.  Instructions sent through patient portal.

## 2023-02-24 ENCOUNTER — Other Ambulatory Visit: Payer: Self-pay | Admitting: Physician Assistant

## 2023-02-24 DIAGNOSIS — K219 Gastro-esophageal reflux disease without esophagitis: Secondary | ICD-10-CM

## 2023-02-25 ENCOUNTER — Other Ambulatory Visit: Payer: Self-pay | Admitting: Physician Assistant

## 2023-02-25 DIAGNOSIS — R81 Glycosuria: Secondary | ICD-10-CM

## 2023-02-25 MED ORDER — SUCRALFATE 1 GM/10ML PO SUSP: 1.0000 g | Freq: Three times a day (TID) | ORAL | 0 refills | Status: DC

## 2023-02-26 ENCOUNTER — Ambulatory Visit: Payer: Commercial Managed Care - HMO | Admitting: Physician Assistant

## 2023-02-28 ENCOUNTER — Other Ambulatory Visit: Payer: Self-pay | Admitting: Physician Assistant

## 2023-02-28 ENCOUNTER — Other Ambulatory Visit: Payer: Self-pay | Admitting: Nurse Practitioner

## 2023-02-28 MED ORDER — FERROUS SULFATE 325 (65 FE) MG PO TBEC: 325.0000 mg | DELAYED_RELEASE_TABLET | Freq: Every day | ORAL | 1 refills | Status: DC

## 2023-02-28 NOTE — Telephone Encounter (Signed)
Copied from CRM [id number]. Topic: Clinical - Medication Refill >> Feb 28, 2023 11:16 AM Kathryne Eriksson wrote: Most Recent Primary Care Visit:  Provider: Alfredia Ferguson  Department: [facility]  Visit Type: OFFICE VISIT  Date: 02/21/2023  Medication: ferrous sulfate 325 (65 FE) MG EC tablet ,   Has the patient contacted their pharmacy? Yes (Agent: If no, request that the patient contact the pharmacy for the refill. If patient does not wish to contact the pharmacy document the reason why and proceed with request.) (Agent: If yes, when and what did the pharmacy advise?)  Is this the correct pharmacy for this prescription? Yes If no, delete pharmacy and type the correct one.  This is the patient's preferred pharmacy:  [facility] [address] [place], [place] - [address] [place] [address] Phone: [phone] Fax: [phone]   Has the prescription been filled recently? No  Is the patient out of the medication? Yes  Has the patient been seen for an appointment in the last year OR does the patient have an upcoming appointment? Yes  Can we respond through MyChart? Yes  Agent: Please be advised that Rx refills may take up to 3 business days. We ask that you follow-up with your pharmacy.

## 2023-03-05 ENCOUNTER — Telehealth: Payer: Self-pay | Admitting: Nurse Practitioner

## 2023-03-05 NOTE — Telephone Encounter (Signed)
 PT is calling to find out if we can refill sucralfate and send it to [facility] in [facility]

## 2023-03-05 NOTE — Telephone Encounter (Signed)
 Carafate was filled by PCP. Patient was informed to contact PCP office. Once patient has been seen in follow-up with our office she can discuss renewal of prescription if needed with our providers. Patient voiced understanding.

## 2023-03-13 ENCOUNTER — Encounter (HOSPITAL_COMMUNITY): Payer: Self-pay

## 2023-03-14 ENCOUNTER — Telehealth (HOSPITAL_COMMUNITY): Payer: Self-pay | Admitting: *Deleted

## 2023-03-14 NOTE — Telephone Encounter (Signed)
 Attempted to call patient regarding upcoming cardiac CT appointment. Left message on voicemail with name and callback number  Larey Brick RN Navigator Cardiac Imaging [facility] [phone] Office [phone] Cell

## 2023-03-15 ENCOUNTER — Ambulatory Visit (HOSPITAL_COMMUNITY)
Admission: RE | Admit: 2023-03-15 | Discharge: 2023-03-15 | Disposition: A | Discharge status: Home / Self Care | Payer: Commercial Managed Care - HMO | Source: Ambulatory Visit | Attending: Internal Medicine | Admitting: Internal Medicine

## 2023-03-15 DIAGNOSIS — I5022 Chronic systolic (congestive) heart failure: Secondary | ICD-10-CM | POA: Diagnosis present

## 2023-03-15 MED ORDER — NITROGLYCERIN 0.4 MG SL SUBL
SUBLINGUAL_TABLET | SUBLINGUAL | Status: AC
Start: 2023-03-15 — End: ?
  Filled 2023-03-15: qty 2

## 2023-03-15 MED ORDER — METOPROLOL TARTRATE 5 MG/5ML IV SOLN
5.0000 mg | Freq: Once | INTRAVENOUS | Status: AC
  Administered 2023-03-15: 5 mg via INTRAVENOUS

## 2023-03-15 MED ORDER — METOPROLOL TARTRATE 5 MG/5ML IV SOLN
INTRAVENOUS | Status: AC
  Filled 2023-03-15: qty 10

## 2023-03-15 MED ORDER — NITROGLYCERIN 0.4 MG SL SUBL
0.8000 mg | SUBLINGUAL_TABLET | Freq: Once | SUBLINGUAL | Status: AC
  Administered 2023-03-15: 0.8 mg via SUBLINGUAL

## 2023-03-15 MED ORDER — IOHEXOL 350 MG/ML SOLN
95.0000 mL | Freq: Once | INTRAVENOUS | Status: AC | PRN
  Administered 2023-03-15: 95 mL via INTRAVENOUS

## 2023-03-17 ENCOUNTER — Encounter: Payer: Self-pay | Admitting: Internal Medicine

## 2023-03-17 DIAGNOSIS — I5022 Chronic systolic (congestive) heart failure: Secondary | ICD-10-CM

## 2023-03-17 DIAGNOSIS — I2510 Atherosclerotic heart disease of native coronary artery without angina pectoris: Secondary | ICD-10-CM

## 2023-03-21 MED ORDER — ASPIRIN 81 MG PO TBEC
81.0000 mg | DELAYED_RELEASE_TABLET | Freq: Every day | ORAL | Status: AC
Start: 2023-03-21 — End: ?

## 2023-03-21 MED ORDER — ATORVASTATIN CALCIUM 20 MG PO TABS: 20.0000 mg | ORAL_TABLET | Freq: Every day | ORAL | 3 refills | Status: DC

## 2023-03-21 NOTE — Telephone Encounter (Addendum)
-----   Message from Nurse Nehemiah Settle S sent at 03/20/2023 12:24 PM EST -----  ----- Message ----- From: Orbie Pyo, MD Sent: 03/17/2023   8:20 AM EST To: Cv Div Ch St Triage  Start ASA 81, atorvastatin 20 and needs lipids/lfts/Lp(a) in 2 months.  Also needs CMR re: NICM ______________________________________________________   Left message to call back and sent detailed my chart message for review by patient and her family who helps her.  Will await any questions from them.  Message to K. Causey.

## 2023-03-21 NOTE — Telephone Encounter (Signed)
 Patient called back and I reviewed w her and her daughter in background the recommendations and the instructions that have been sent to [facility].  No questions or concerns.

## 2023-03-26 ENCOUNTER — Encounter (HOSPITAL_COMMUNITY): Payer: Self-pay

## 2023-03-26 ENCOUNTER — Encounter: Payer: Self-pay | Admitting: Physician Assistant

## 2023-03-26 ENCOUNTER — Other Ambulatory Visit (INDEPENDENT_AMBULATORY_CARE_PROVIDER_SITE_OTHER): Payer: Commercial Managed Care - HMO

## 2023-03-26 DIAGNOSIS — R81 Glycosuria: Secondary | ICD-10-CM | POA: Diagnosis not present

## 2023-03-26 LAB — BASIC METABOLIC PANEL
BUN: 22 mg/dL (ref 6–23)
CO2: 24 meq/L (ref 19–32)
Calcium: 9.1 mg/dL (ref 8.4–10.5)
Chloride: 99 meq/L (ref 96–112)
Creatinine, Ser: 1.14 mg/dL (ref 0.40–1.20)
GFR: 59.46 mL/min — ABNORMAL LOW (ref >60.00)
Glucose, Bld: 184 mg/dL — ABNORMAL HIGH (ref 70–99)
Potassium: 3.5 meq/L (ref 3.5–5.1)
Sodium: 134 meq/L — ABNORMAL LOW (ref 135–145)

## 2023-03-26 LAB — HEMOGLOBIN A1C: Hgb A1c MFr Bld: 6.7 % — ABNORMAL HIGH (ref 4.6–6.5)

## 2023-03-27 ENCOUNTER — Encounter: Payer: Self-pay | Admitting: Physician Assistant

## 2023-03-27 ENCOUNTER — Other Ambulatory Visit: Payer: Self-pay | Admitting: Physician Assistant

## 2023-03-27 DIAGNOSIS — E1165 Type 2 diabetes mellitus with hyperglycemia: Secondary | ICD-10-CM

## 2023-03-27 MED ORDER — TIRZEPATIDE 2.5 MG/0.5ML ~~LOC~~ SOAJ: 2.5000 mg | SUBCUTANEOUS | 0 refills | Status: DC

## 2023-03-27 MED ORDER — TIRZEPATIDE 5 MG/0.5ML ~~LOC~~ SOAJ: 5.0000 mg | SUBCUTANEOUS | 1 refills | Status: DC

## 2023-03-29 ENCOUNTER — Telehealth: Payer: Self-pay

## 2023-03-29 ENCOUNTER — Telehealth: Payer: Self-pay | Admitting: Physician Assistant

## 2023-03-29 ENCOUNTER — Other Ambulatory Visit (HOSPITAL_COMMUNITY): Payer: Self-pay

## 2023-03-29 NOTE — Telephone Encounter (Signed)
 Copied from CRM [id number]. Topic: Clinical - Prescription Issue >> Mar 28, 2023  5:12 PM Martha Clan wrote: Reason for CRM: tirzepatide [facility]) 2.5 MG/0.5ML Pen [[id number]] Pharmacy is requesting the prior authorization for listed medication.

## 2023-03-29 NOTE — Telephone Encounter (Signed)
 Pharmacy Patient Advocate Encounter   Received notification from Physician's Office that prior authorization for Mounjaro 5MG /0.5ML auto-injectors is required/requested.   Insurance verification completed.   The patient is insured through [facility] .   Per test claim: PA required; PA submitted to above mentioned insurance via CoverMyMeds Key/confirmation #/EOC (Key: [id number])      Status is pending

## 2023-04-02 ENCOUNTER — Other Ambulatory Visit (HOSPITAL_COMMUNITY): Payer: Self-pay

## 2023-04-02 ENCOUNTER — Ambulatory Visit: Payer: Commercial Managed Care - HMO | Admitting: Obstetrics and Gynecology

## 2023-04-02 VITALS — BP 118/62 | HR 71 | Ht 64.0 in | Wt 283.0 lb

## 2023-04-02 DIAGNOSIS — Z7689 Persons encountering health services in other specified circumstances: Secondary | ICD-10-CM

## 2023-04-02 DIAGNOSIS — Z1231 Encounter for screening mammogram for malignant neoplasm of breast: Secondary | ICD-10-CM | POA: Diagnosis not present

## 2023-04-02 DIAGNOSIS — N920 Excessive and frequent menstruation with regular cycle: Secondary | ICD-10-CM

## 2023-04-02 MED ORDER — SLYND 4 MG PO TABS: 1.0000 | ORAL_TABLET | Freq: Every day | ORAL | 4 refills | Status: DC

## 2023-04-02 NOTE — Progress Notes (Signed)
   GYNECOLOGY PROGRESS NOTE  History:  43 y.o. No obstetric history on file. presents to [facility]  office today for problem gyn visit. Reports has always had heavy cycles and painful periods. Reports they last 7-10 days. Cycle comes once a month. Used to take tylenol that helped some. Has had a BTL.   She also reports stomach continues to grow larger over the last two years, no pain. Has been followed by other specialties. She has been prescribed mounjaro for DM. Also has CHF   Health Maintenance Due  Topic Date Due   Pneumococcal Vaccine 8-66 Years old (1 of 2 - PCV) Never done   HIV Screening  Never done   Hepatitis C Screening  Never done   DTaP/Tdap/Td (1 - Tdap) Never done   Cervical Cancer Screening (HPV/Pap Cotest)  06/12/2011   INFLUENZA VACCINE  08/16/2022   COVID-19 Vaccine (1 - 2024-25 season) Never done     Review of Systems:  Pertinent items are noted in HPI.   Objective:  Physical Exam Blood pressure 118/62, pulse 71, height 5\' 4"  (1.626 m), weight 283 lb (128.4 kg), last menstrual period 04/01/2023. VS reviewed, nursing note reviewed,  Constitutional: well developed, well nourished, no distress HEENT: normocephalic CV: normal rate Pulm/chest wall: normal effort Breast Exam: deferred Abdomen: soft Neuro: alert and oriented x 3 Skin: warm, dry Psych: affect normal Pelvic exam: Cervix pink, visually closed, without lesion, scant white creamy discharge, vaginal walls and external genitalia normal Bimanual exam: Cervix 0/long/high, firm, anterior, neg CMT, uterus nontender, nonenlarged, adnexa without tenderness, enlargement, or mass  Assessment & Plan:  1. Menorrhagia with regular cycle (Primary) Discussed options for cycle management to include IUD, does not desire IUD at this time  - Drospirenone (SLYND) 4 MG TABS; Take 1 tablet (4 mg total) by mouth daily.  Dispense: 84 tablet; Refill: 4 - US PELVIC COMPLETE WITH TRANSVAGINAL; Future  2. Encounter to establish  care   3. Screening mammogram for breast cancer  - MM 3D SCREENING MAMMOGRAM BILATERAL BREAST; Future   Albertine Grates, FNP

## 2023-04-02 NOTE — Telephone Encounter (Signed)
 Good morning, this PA is still pending as of this morning. [employer] has not responded

## 2023-04-03 ENCOUNTER — Telehealth: Payer: Self-pay | Admitting: Emergency Medicine

## 2023-04-03 NOTE — Telephone Encounter (Signed)
 Copied from CRM [id number]. Topic: General - Other >> Apr 03, 2023  4:27 PM Florestine Avers wrote: Reason for CRM: Patient called in stating that she needs a copy of her A1C levels sent over to the [facility] on [facility] [address] Patient is requesting a call back once this is complete.

## 2023-04-04 ENCOUNTER — Ambulatory Visit (HOSPITAL_BASED_OUTPATIENT_CLINIC_OR_DEPARTMENT_OTHER)
Admission: RE | Admit: 2023-04-04 | Discharge: 2023-04-04 | Discharge status: Home / Self Care | Source: Ambulatory Visit | Attending: Obstetrics and Gynecology

## 2023-04-04 ENCOUNTER — Encounter (HOSPITAL_BASED_OUTPATIENT_CLINIC_OR_DEPARTMENT_OTHER): Payer: Self-pay | Admitting: Obstetrics and Gynecology

## 2023-04-04 ENCOUNTER — Encounter (HOSPITAL_BASED_OUTPATIENT_CLINIC_OR_DEPARTMENT_OTHER): Payer: Self-pay

## 2023-04-04 ENCOUNTER — Ambulatory Visit (HOSPITAL_BASED_OUTPATIENT_CLINIC_OR_DEPARTMENT_OTHER)
Admission: RE | Admit: 2023-04-04 | Discharge: 2023-04-04 | Disposition: A | Discharge status: Home / Self Care | Source: Ambulatory Visit | Attending: Obstetrics and Gynecology | Admitting: Obstetrics and Gynecology

## 2023-04-04 DIAGNOSIS — N920 Excessive and frequent menstruation with regular cycle: Secondary | ICD-10-CM | POA: Insufficient documentation

## 2023-04-04 NOTE — Telephone Encounter (Signed)
 Spoke with pt for clarification.  Pt thought that ins did not have lab results and that is why they denied Mounjaro.  Advised pt that it was denied because her A1c is not greater than 7.5.

## 2023-04-04 NOTE — Telephone Encounter (Signed)
 Pharmacy Patient Advocate Encounter  Received notification from [facility]  that Prior Authorization for Greggory Keen has been DENIED.  See denial reason below.    PA #/Case ID/Reference #: (Key: [id number])  DENIAL DUE TO Criteria for medical necessity were not met. Your records with [place] and the information submitted by your doctor do not show you meet the following criteria: (1) documentation of one of the following: a) adequate trial and failure for at least 90 days or are unable to use metformin daily or b) have an A1c (a blood test that is an indicator of blood sugar levels over the past 3months) of 7.5 percent or greater and require combination therapy. Please talk to your doctor about the treatment for diabetes mellitus (high blood sugar levels).

## 2023-04-12 ENCOUNTER — Other Ambulatory Visit: Payer: Self-pay | Admitting: Physician Assistant

## 2023-04-12 DIAGNOSIS — K219 Gastro-esophageal reflux disease without esophagitis: Secondary | ICD-10-CM

## 2023-04-12 NOTE — Telephone Encounter (Signed)
 Copied from CRM [id number]. Topic: Clinical - Medication Refill >> Apr 12, 2023  3:43 PM Gurney Maxin H wrote: Most Recent Primary Care Visit:  Provider: [facility]  Department[facility]  Visit Type: LAB VISIT  Date: 03/26/2023  Medication: sucralfate (CARAFATE) 1 GM/10ML suspension  Has the patient contacted their pharmacy? No, no refills (Agent: If no, request that the patient contact the pharmacy for the refill. If patient does not wish to contact the pharmacy document the reason why and proceed with request.) (Agent: If yes, when and what did the pharmacy advise?)  Is this the correct pharmacy for this prescription? Yes If no, delete pharmacy and type the correct one.  This is the patient's preferred pharmacy:  [facility] [address] [place], [place] - [address] [place] [address] Phone: [phone] Fax: [phone]   Has the prescription been filled recently? No  Is the patient out of the medication? Yes  Has the patient been seen for an appointment in the last year OR does the patient have an upcoming appointment? Yes  Can we respond through MyChart? Yes  Agent: Please be advised that Rx refills may take up to 3 business days. We ask that you follow-up with your pharmacy.

## 2023-04-12 NOTE — Telephone Encounter (Signed)
 Copied from CRM [id number]. Topic: Clinical - Medication Refill >> Apr 12, 2023  3:43 PM Gurney Maxin H wrote: Most Recent Primary Care Visit:  Provider: [facility]  [facility]  Visit Type: LAB VISIT  Date: 03/26/2023  Medication: sucralfate (CARAFATE) 1 GM/10ML suspension  Has the patient contacted their pharmacy? No, no refills (Agent: If no, request that the patient contact the pharmacy for the refill. If patient does not wish to contact the pharmacy document the reason why and proceed with request.) (Agent: If yes, when and what did the pharmacy advise?)  Is this the correct pharmacy for this prescription? Yes If no, delete pharmacy and type the correct one.  This is the patient's preferred pharmacy:  [facility] [address] [place], [place] - [address] [place] [address] Phone: [phone] Fax: [phone]   Has the prescription been filled recently? No  Is the patient out of the medication? Yes  Has the patient been seen for an appointment in the last year OR does the patient have an upcoming appointment? Yes  Can we respond through MyChart? Yes  Agent: Please be advised that Rx refills may take up to 3 business days. We ask that you follow-up with your pharmacy. >> Apr 12, 2023  3:49 PM Gurney Maxin H wrote: Agent added the ferrous sulfate 325 (65 FE) MG EC tablet to medication management but not added to medication list, a different crm for refill request medication not on list should have been sent for this medication.

## 2023-04-13 ENCOUNTER — Other Ambulatory Visit: Payer: Self-pay | Admitting: Physician Assistant

## 2023-04-13 DIAGNOSIS — K219 Gastro-esophageal reflux disease without esophagitis: Secondary | ICD-10-CM

## 2023-04-17 ENCOUNTER — Telehealth: Payer: Self-pay | Admitting: *Deleted

## 2023-04-17 NOTE — Telephone Encounter (Signed)
 Copied from CRM [id number]. Topic: General - Other >> Apr 17, 2023  3:46 PM Cammy Copa D wrote: Reason for CRM: PT's daughter states that Ms. Punches's Mounjaro 5MG /0.5ML auto-injectors have been denied by insurance. Notified PT that denial was due to the Criteria for medical necessity not being met. PT's daughter wondering if there was any way to double check documentation/medical necessity & see if there was any way this medication could be covered by her insurance.

## 2023-04-18 ENCOUNTER — Other Ambulatory Visit (HOSPITAL_COMMUNITY): Payer: Self-pay

## 2023-04-23 ENCOUNTER — Other Ambulatory Visit (HOSPITAL_BASED_OUTPATIENT_CLINIC_OR_DEPARTMENT_OTHER): Payer: Self-pay

## 2023-04-23 ENCOUNTER — Ambulatory Visit: Admitting: Physician Assistant

## 2023-04-23 ENCOUNTER — Encounter: Payer: Self-pay | Admitting: Physician Assistant

## 2023-04-23 VITALS — BP 118/67 | HR 71 | Ht 64.0 in | Wt 283.4 lb

## 2023-04-23 DIAGNOSIS — E1165 Type 2 diabetes mellitus with hyperglycemia: Secondary | ICD-10-CM | POA: Insufficient documentation

## 2023-04-23 DIAGNOSIS — R140 Abdominal distension (gaseous): Secondary | ICD-10-CM | POA: Diagnosis not present

## 2023-04-23 DIAGNOSIS — Z7984 Long term (current) use of oral hypoglycemic drugs: Secondary | ICD-10-CM | POA: Diagnosis not present

## 2023-04-23 DIAGNOSIS — J302 Other seasonal allergic rhinitis: Secondary | ICD-10-CM | POA: Diagnosis not present

## 2023-04-23 MED ORDER — EZETIMIBE 10 MG PO TABS
10.0000 mg | ORAL_TABLET | Freq: Every day | ORAL | 3 refills | Status: DC
  Filled 2023-04-23: qty 90, 90d supply, fill #0
  Filled 2023-08-04: qty 90, 90d supply, fill #1

## 2023-04-23 NOTE — Assessment & Plan Note (Signed)
 No fluid wave, no peripheral edema. Pt is euvolemic today. Given this is a repeated complaint, will check an abdominal US

## 2023-04-23 NOTE — Assessment & Plan Note (Signed)
 Recommending claritin daily, zatidor otc for itchy eyes

## 2023-04-23 NOTE — Assessment & Plan Note (Addendum)
 Intolerant to crestor switching to zetia   Greggory Keen was denied on old insurance, advised we may have to start metformin first, will check PA with new insurance

## 2023-04-23 NOTE — Telephone Encounter (Signed)
 Patient has new insurance, can we try to re-run the PA for mounjaro?

## 2023-04-23 NOTE — Progress Notes (Signed)
 Established patient visit   Patient: Anna Chapman   DOB: 01-02-1981   43 y.o. Female  MRN: [id number] Visit Date: 04/23/2023  Today's healthcare provider: Alfredia Ferguson, PA-C   Cc. Abdominal bloating  Subjective     Pt presents today with continued abdominal discomfort and bloating. She reports she has continued to gain weight. Denies any abdominal pain, nausea, lower extremity edema.   She has been unable to start the [facility], it was denied. However, she has gotten new insurance since then and wants [place] to try again.  She also reports severe seasonal allergies, rhinorrhea and itchy eyes. She tried zyrtec which did not help.  She also reports after starting the cholesterol medicine, she felt itchy all over. Reports it happens with every dose, denies rash.  Medications: Outpatient Medications Prior to Visit  Medication Sig   aspirin EC 81 MG tablet Take 1 tablet (81 mg total) by mouth daily. Swallow whole.   dapagliflozin propanediol (FARXIGA) 10 MG TABS tablet Take 1 tablet (10 mg total) by mouth daily.   docusate sodium (COLACE) 50 MG capsule Take 1 capsule (50 mg total) by mouth 2 (two) times daily.   Drospirenone (SLYND) 4 MG TABS Take 1 tablet (4 mg total) by mouth daily.   ferrous sulfate 325 (65 FE) MG EC tablet Take 1 tablet (325 mg total) by mouth daily. (Patient not taking: Reported on 04/02/2023)   metoprolol succinate (TOPROL XL) 25 MG 24 hr tablet Take 0.5 tablets (12.5 mg total) by mouth at bedtime.   pantoprazole (PROTONIX) 40 MG tablet Take 1 tablet by mouth twice daily, 30 minutes before breakfast and dinner. (Patient taking differently: Take 40 mg by mouth daily as needed ([place]). 30 minutes before breakfast and dinner.)   sacubitril-valsartan (ENTRESTO) 97-103 MG Take 1 tablet by mouth 2 (two) times daily.   spironolactone (ALDACTONE) 25 MG tablet Take 1 tablet (25 mg total) by mouth daily.   sucralfate (CARAFATE) 1 GM/10ML suspension Take 10 mLs (1 g  total) by mouth in the morning, at noon, and at bedtime.   tirzepatide [facility]) 2.5 MG/0.5ML Pen Inject 2.5 mg into the skin once a week. For four weeks. (Patient not taking: Reported on 04/02/2023)   tirzepatide [facility]) 5 MG/0.5ML Pen Inject 5 mg into the skin once a week. Start after 4 weeks of the 2.5 mg dose (Patient not taking: Reported on 04/02/2023)   torsemide (DEMADEX) 20 MG tablet Take 2 tablets (40 mg total) by mouth daily.   [DISCONTINUED] atorvastatin (LIPITOR) 20 MG tablet Take 1 tablet (20 mg total) by mouth daily.   No facility-administered medications prior to visit.    Review of Systems  Constitutional:  Negative for fatigue and fever.  Respiratory:  Negative for cough and shortness of breath.   Cardiovascular:  Negative for chest pain and leg swelling.  Gastrointestinal:  Positive for abdominal distention. Negative for abdominal pain.  Neurological:  Negative for dizziness and headaches.       Objective    BP 118/67   Pulse 71   Ht 5\' 4"  (1.626 m)   Wt 283 lb 6.4 oz (128.5 kg)   LMP 04/01/2023 (Exact Date)   BMI 48.65 kg/m    Physical Exam Vitals reviewed.  Constitutional:      Appearance: She is not ill-appearing.  HENT:     Head: Normocephalic.  Eyes:     Conjunctiva/sclera: Conjunctivae normal.  Cardiovascular:     Rate and Rhythm: Normal rate.  Pulmonary:     Effort: Pulmonary effort is normal. No respiratory distress.  Abdominal:     General: Abdomen is protuberant.     Palpations: Abdomen is soft. There is no fluid wave.     Tenderness: There is no abdominal tenderness.  Musculoskeletal:     Right lower leg: No edema.     Left lower leg: No edema.  Neurological:     Mental Status: She is alert and oriented to person, place, and time.  Psychiatric:        Mood and Affect: Mood normal.        Behavior: Behavior normal.     No results found for any visits on 04/23/23.  Assessment & Plan    Type 2 diabetes mellitus with hyperglycemia,  without long-term current use of insulin (HCC) Assessment & Plan: Intolerant to crestor switching to zetia   Greggory Keen was denied on old insurance, advised we may have to start metformin first, will check PA with new insurance  Orders: -     Ezetimibe; Take 1 tablet (10 mg total) by mouth daily.  Dispense: 90 tablet; Refill: 3  Abdominal bloating Assessment & Plan: No fluid wave, no peripheral edema. Pt is euvolemic today. Given this is a repeated complaint, will check an abdominal US    Orders: -     US Abdomen Complete  Seasonal allergies Assessment & Plan: Recommending claritin daily, zatidor otc for itchy eyes    Return if symptoms worsen or fail to improve.       Alfredia Ferguson, PA-C  [facility] [phone] (phone) [phone] (fax)  [facility]

## 2023-04-24 ENCOUNTER — Other Ambulatory Visit (HOSPITAL_BASED_OUTPATIENT_CLINIC_OR_DEPARTMENT_OTHER): Payer: Self-pay

## 2023-04-25 ENCOUNTER — Other Ambulatory Visit (HOSPITAL_COMMUNITY): Payer: Self-pay

## 2023-04-25 NOTE — Telephone Encounter (Signed)
 Per test claim, Ozempic is the preferred medication. Test claim for Ozempic showed that metformin must be trialed prior to PA for Ozempic, likely the same for [facility].

## 2023-04-26 ENCOUNTER — Other Ambulatory Visit (HOSPITAL_BASED_OUTPATIENT_CLINIC_OR_DEPARTMENT_OTHER)

## 2023-04-29 ENCOUNTER — Ambulatory Visit (HOSPITAL_COMMUNITY)
Admission: RE | Admit: 2023-04-29 | Discharge: 2023-04-29 | Disposition: A | Discharge status: Home / Self Care | Source: Ambulatory Visit | Attending: Internal Medicine | Admitting: Internal Medicine

## 2023-04-29 ENCOUNTER — Other Ambulatory Visit: Payer: Self-pay | Admitting: Internal Medicine

## 2023-04-29 DIAGNOSIS — I5022 Chronic systolic (congestive) heart failure: Secondary | ICD-10-CM

## 2023-04-29 MED ORDER — GADOBUTROL 1 MMOL/ML IV SOLN
10.0000 mL | Freq: Once | INTRAVENOUS | Status: AC | PRN
  Administered 2023-04-29: 10 mL via INTRAVENOUS

## 2023-04-30 ENCOUNTER — Encounter: Payer: Self-pay | Admitting: Internal Medicine

## 2023-04-30 ENCOUNTER — Ambulatory Visit (HOSPITAL_BASED_OUTPATIENT_CLINIC_OR_DEPARTMENT_OTHER)
Admission: RE | Admit: 2023-04-30 | Discharge: 2023-04-30 | Disposition: A | Discharge status: Home / Self Care | Source: Ambulatory Visit | Attending: Physician Assistant | Admitting: Physician Assistant

## 2023-04-30 DIAGNOSIS — R140 Abdominal distension (gaseous): Secondary | ICD-10-CM | POA: Diagnosis present

## 2023-05-01 ENCOUNTER — Encounter: Payer: Self-pay | Admitting: Physician Assistant

## 2023-05-02 ENCOUNTER — Other Ambulatory Visit: Payer: Self-pay | Admitting: Physician Assistant

## 2023-05-02 DIAGNOSIS — K219 Gastro-esophageal reflux disease without esophagitis: Secondary | ICD-10-CM

## 2023-05-03 ENCOUNTER — Other Ambulatory Visit (HOSPITAL_BASED_OUTPATIENT_CLINIC_OR_DEPARTMENT_OTHER): Payer: Self-pay

## 2023-05-06 ENCOUNTER — Other Ambulatory Visit: Payer: Self-pay | Admitting: *Deleted

## 2023-05-06 ENCOUNTER — Ambulatory Visit: Admitting: Physician Assistant

## 2023-05-06 ENCOUNTER — Telehealth: Payer: Self-pay

## 2023-05-06 ENCOUNTER — Telehealth: Payer: Self-pay | Admitting: Internal Medicine

## 2023-05-06 DIAGNOSIS — K219 Gastro-esophageal reflux disease without esophagitis: Secondary | ICD-10-CM

## 2023-05-06 MED ORDER — SUCRALFATE 1 GM/10ML PO SUSP: 1.0000 g | Freq: Three times a day (TID) | ORAL | 0 refills | Status: DC

## 2023-05-06 NOTE — Telephone Encounter (Signed)
 Initial Comment Caller states she is having heart pain, she is also asking for a refill on her medication, the medication used to help with her heart and chest pain. Additional Comment Office hours provided. Declined triage. Translation No Disp. Time [facility] Time) Disposition Final User 05/06/2023 9:06:02 AM General Information Provided Yes Joycelyn Noa Final Disposition 05/06/2023 9:06:02 AM General Information Provided Yes Joycelyn Noa

## 2023-05-06 NOTE — Telephone Encounter (Signed)
 Spoke with pt's daughter and advised her that pt needed to go to ED now based on another triage note to cardiology complaining of chest paid, jaw pain, and pain radiating into arm.  Sucralfate  also refilled per pt's request. Pt and pt's daughter voiced understanding and agreed to go to ED.

## 2023-05-06 NOTE — Telephone Encounter (Signed)
   Pt c/o of Chest Pain: STAT if active CP, including tightness, pressure, jaw pain, radiating pain to shoulder/upper arm/back, CP unrelieved by Nitro. Symptoms reported of SOB, nausea, vomiting, sweating.  1. Are you having CP right now? yes    2. Are you experiencing any other symptoms (ex. SOB, nausea, vomiting, sweating)? no   3. Is your CP continuous or coming and going? Comes and goes   4. Have you taken Nitroglycerin ? bo   5. How long have you been experiencing CP? A couple days ago, yesterday was really bad    6. If NO CP at time of call then end call with telling Pt to call back or call 911 if Chest pain returns prior to return call from triage team.

## 2023-05-06 NOTE — Telephone Encounter (Signed)
 Appt scheduled

## 2023-05-06 NOTE — Telephone Encounter (Signed)
 Spoke with daughter per DPR and she states patient is having really bad chest pain. Informed her if patient is having active chest pain she needs to call 911 and go the ED

## 2023-05-07 ENCOUNTER — Other Ambulatory Visit: Payer: Self-pay | Admitting: *Deleted

## 2023-05-07 ENCOUNTER — Telehealth: Payer: Self-pay

## 2023-05-07 ENCOUNTER — Other Ambulatory Visit: Payer: Self-pay | Admitting: Physician Assistant

## 2023-05-07 DIAGNOSIS — E1165 Type 2 diabetes mellitus with hyperglycemia: Secondary | ICD-10-CM

## 2023-05-07 MED ORDER — TIRZEPATIDE 2.5 MG/0.5ML ~~LOC~~ SOAJ: 2.5000 mg | SUBCUTANEOUS | 0 refills | Status: DC

## 2023-05-07 NOTE — Telephone Encounter (Signed)
 Done

## 2023-05-07 NOTE — Telephone Encounter (Signed)
 Copied from CRM [id number]. Topic: Clinical - Medication Refill >> May 07, 2023  1:58 PM [name] wrote: Most Recent Primary Care Visit:  Provider: Trenton Frock  Department: [facility]  Visit Type: OFFICE VISIT  Date: 04/23/2023  Medication: pantoprazole  (PROTONIX ) 40 MG tablet  Has the patient contacted their pharmacy? Yes, pharmacy said the patient would have to contact the providers office because the medication showed no refills on their end (Agent: If no, request that the patient contact the pharmacy for the refill. If patient does not wish to contact the pharmacy document the reason why and proceed with request.) (Agent: If yes, when and what did the pharmacy advise?)  Is this the correct pharmacy for this prescription? Yes If no, delete pharmacy and type the correct one.  This is the patient's preferred pharmacy:  [id number] - [place], [place] - [address] [name] [address] Phone: [phone] Fax: [phone]   Has the prescription been filled recently? No  Is the patient out of the medication? Yes  Has the patient been seen for an appointment in the last year OR does the patient have an upcoming appointment? Yes  Can we respond through MyChart? Yes  Agent: Please be advised that Rx refills may take up to 3 business days. We ask that you follow-up with your pharmacy.

## 2023-05-07 NOTE — Telephone Encounter (Signed)
 LMOM for pt to return call.

## 2023-05-07 NOTE — Telephone Encounter (Signed)
 Reason for CRM: Patient called and stated she never started taking tirzepatide  (MOUNJARO) 2.5 MG/0.5ML Pen [[id number]]// .  Would you like me to try to resend the 2.5mg ?

## 2023-05-07 NOTE — Telephone Encounter (Signed)
 Copied from CRM [id number]. Topic: Clinical - Prescription Issue >> May 07, 2023  2:03 PM [name] wrote: Reason for CRM: Patient asked for the provider to submit a new prior authorization for Mounjaro 5MG /0.5ML auto-injectors to her updated [employer]; please follow up with patient regarding this request [phone]

## 2023-05-07 NOTE — Telephone Encounter (Signed)
 Pharmacy Patient Advocate Encounter   Received notification from CoverMyMeds that prior authorization for  Mounjaro 2.5MG /0.5ML auto-injectors is required/requested.   Insurance verification completed.   The patient is insured through [facility] .   Per test claim: PA required; PA submitted to above mentioned insurance via CoverMyMeds Key/confirmation #/EOC [facility] Status is pending

## 2023-05-08 NOTE — Telephone Encounter (Signed)
 Yes, it was submitted for Type 2 diabetes, however, pt has not trialed preferred medications to treat diabetes, per insurance denial letter. Please consider changing to one of the preferred medications (listed previously) if clinically appropriate, or advise as to why pt is unable to try preferred medications. Thank you

## 2023-05-08 NOTE — Telephone Encounter (Signed)
 Pharmacy Patient Advocate Encounter  Received notification from [facility] that Prior Authorization for Mounjaro 2.5MG /0.5ML auto-injectors has been DENIED.  Full denial letter will be uploaded to the media tab. See denial reason below.   PA #/Case ID/Reference #: [id number]

## 2023-05-09 ENCOUNTER — Other Ambulatory Visit: Payer: Self-pay | Admitting: Nurse Practitioner

## 2023-05-13 ENCOUNTER — Telehealth: Payer: Self-pay

## 2023-05-13 NOTE — Telephone Encounter (Signed)
FYI. Pt going to UC.

## 2023-05-13 NOTE — Telephone Encounter (Signed)
 Initial Comment Caller states she has blurry vision(not loss of vision) and she is urinating more often. She has an itchy throat and bilateral knee pain. She sees [employer] [facility]. GOTO Facility Not Listed [facility] [place] Translation No Nurse Assessment Nurse: Adams Adams, RN, Orelia Binet Date/Time (Eastern Time): 05/13/2023 4:26:50 PM Confirm and document reason for call. If symptomatic, describe symptoms. ---Caller states that she starting having blurred vision on and off for 2-3 days, she does still have vision. Caller states that she is urinating more often for the past 2-3 days, denies pain when urinating or hematuria. Caller states that she has been having fevers but she has not taken her temperature. Caller states that she is having a sore throat. Caller states that she was seen in the ER for chest pain and was told that everything was normal. Does the patient have any new or worsening symptoms? ---Yes Will a triage be completed? ---Yes Related visit to physician within the last 2 weeks? ---Yes Does the PT have any chronic conditions? (i.e. diabetes, asthma, this includes High risk factors for pregnancy, etc.) ---Yes List chronic conditions. ---heart issues, high cholesterol, HTN Is the patient pregnant or possibly pregnant? (Ask all females between the ages of 59-55) ---No Is this a behavioral health or substance abuse call? ---No PLEASE NOTE: All timestamps contained within this report are represented as [place] Standard Time. CONFIDENTIALTY NOTICE: This fax transmission is intended only for the addressee. It contains information that is legally privileged, confidential or otherwise protected from use or disclosure. If you are not the intended recipient, you are strictly prohibited from reviewing, disclosing, copying using or disseminating any of this information or taking any action in reliance on or regarding this information. If you have received this fax in error,  please notify us  immediately by telephone so that we can arrange for its return to us . Phone: [phone], Toll-Free: [phone], Fax: [phone] [email or web address] [birth year] Page: 2 of 2 CallId: [id number] Guidelines Guideline Title Affirmed Question Affirmed Notes Nurse Date/Time [facility] Cancer Time) Vision Loss or Change [1] Eye pain AND [2] brief (now gone) blurred vision or visual changes Imrich, RNOrelia Binet 05/13/2023 4:36:35 PM Disp. Time [facility] Time) Disposition Final User 05/13/2023 4:44:38 PM See HCP within 4 Hours (or PCP triage) Yes Imrich, RN, Orelia Binet Final Disposition 05/13/2023 4:44:38 PM See HCP within 4 Hours (or PCP triage) Yes Imrich, RN, Cassie Click Disagree/Comply Comply Caller Understands Yes PreDisposition Call Doctor Care Advice Given Per Guideline SEE HCP (OR PCP TRIAGE) WITHIN 4 HOURS: * IF OFFICE WILL BE OPEN: You need to be seen within the next 3 or 4 hours. Call your doctor (or NP/PA) now or as soon as the office opens. ANOTHER ADULT SHOULD DRIVE: * It is better and safer if another adult drives instead of you. CALL BACK IF: * You become worse CARE ADVICE given per Vision Loss or Change (Adult) guideline. Referrals GO TO FACILITY OTHER - SPECIFY

## 2023-05-14 NOTE — Telephone Encounter (Signed)
 Agree UC or ED

## 2023-05-15 ENCOUNTER — Ambulatory Visit: Admitting: Physician Assistant

## 2023-05-15 ENCOUNTER — Encounter: Payer: Self-pay | Admitting: Physician Assistant

## 2023-05-15 VITALS — BP 101/63 | HR 70 | Ht 64.0 in | Wt 278.0 lb

## 2023-05-15 DIAGNOSIS — E1165 Type 2 diabetes mellitus with hyperglycemia: Secondary | ICD-10-CM

## 2023-05-15 DIAGNOSIS — R350 Frequency of micturition: Secondary | ICD-10-CM | POA: Diagnosis not present

## 2023-05-15 DIAGNOSIS — Z7985 Long-term (current) use of injectable non-insulin antidiabetic drugs: Secondary | ICD-10-CM | POA: Diagnosis not present

## 2023-05-15 DIAGNOSIS — J302 Other seasonal allergic rhinitis: Secondary | ICD-10-CM

## 2023-05-15 LAB — POCT URINALYSIS DIP (MANUAL ENTRY)
Bilirubin, UA: NEGATIVE
Glucose, UA: 250 mg/dL — AB
Ketones, POC UA: NEGATIVE mg/dL
Leukocytes, UA: NEGATIVE
Nitrite, UA: NEGATIVE
Protein Ur, POC: NEGATIVE mg/dL
Spec Grav, UA: <=1.005 — AB (ref 1.010–1.025)
Urobilinogen, UA: 0.2 U/dL
pH, UA: 6 (ref 5.0–8.0)

## 2023-05-15 MED ORDER — OLOPATADINE HCL 0.1 % OP SOLN: 1.0000 [drp] | Freq: Two times a day (BID) | OPHTHALMIC | 1 refills | Status: AC

## 2023-05-15 MED ORDER — OZEMPIC (0.25 OR 0.5 MG/DOSE) 2 MG/3ML ~~LOC~~ SOPN: 0.2500 mg | PEN_INJECTOR | SUBCUTANEOUS | 1 refills | Status: DC

## 2023-05-15 MED ORDER — FLUTICASONE PROPIONATE 50 MCG/ACT NA SUSP: 2.0000 | Freq: Every day | NASAL | 6 refills | Status: DC

## 2023-05-15 NOTE — Progress Notes (Signed)
 Established patient visit   Patient: Anna Chapman   DOB: 1980-10-30   43 y.o. Female  MRN: [id number] Visit Date: 05/15/2023  Today's healthcare provider: Trenton Frock, PA-C   Cc. Itchy eyes, urinary frequency  Subjective     Pt reports urinary frequency for the last few days, denies hematuria, reports vaginal bleeding. Denies burning, abdominal pain.  Pt reports itchy, blurred eyes x 3 weeks, taking allergy meds intermittently.   Medications: Outpatient Medications Prior to Visit  Medication Sig   aspirin  EC 81 MG tablet Take 1 tablet (81 mg total) by mouth daily. Swallow whole.   dapagliflozin  propanediol (FARXIGA ) 10 MG TABS tablet Take 1 tablet (10 mg total) by mouth daily.   Drospirenone  (SLYND ) 4 MG TABS Take 1 tablet (4 mg total) by mouth daily.   ezetimibe  (ZETIA ) 10 MG tablet Take 1 tablet (10 mg total) by mouth daily.   ferrous sulfate  325 (65 FE) MG EC tablet Take 1 tablet (325 mg total) by mouth daily.   metoprolol  succinate (TOPROL  XL) 25 MG 24 hr tablet Take 0.5 tablets (12.5 mg total) by mouth at bedtime.   pantoprazole  (PROTONIX ) 40 MG tablet TAKE 1 TABLET BY MOUTH TWICE DAILY 30  MINUTES  BEFORE  BREAKFAST  AND  DINNER.   sacubitril -valsartan  (ENTRESTO ) 97-103 MG Take 1 tablet by mouth 2 (two) times daily.   spironolactone  (ALDACTONE ) 25 MG tablet Take 1 tablet (25 mg total) by mouth daily.   sucralfate  (CARAFATE ) 1 GM/10ML suspension Take 10 mLs (1 g total) by mouth in the morning, at noon, and at bedtime.   torsemide  (DEMADEX ) 20 MG tablet Take 2 tablets (40 mg total) by mouth daily.   [DISCONTINUED] tirzepatide  (MOUNJARO) 2.5 MG/0.5ML Pen Inject 2.5 mg into the skin once a week. For four weeks.   [DISCONTINUED] docusate sodium  (COLACE) 50 MG capsule Take 1 capsule (50 mg total) by mouth 2 (two) times daily.   [DISCONTINUED] tirzepatide  (MOUNJARO) 5 MG/0.5ML Pen Inject 5 mg into the skin once a week. Start after 4 weeks of the 2.5 mg dose (Patient not  taking: Reported on 04/02/2023)   No facility-administered medications prior to visit.    Review of Systems  Constitutional:  Negative for fatigue and fever.  HENT:  Positive for congestion.   Eyes:  Positive for itching.  Respiratory:  Negative for cough and shortness of breath.   Cardiovascular:  Negative for chest pain and leg swelling.  Gastrointestinal:  Negative for abdominal pain.  Genitourinary:  Positive for frequency.  Neurological:  Negative for dizziness and headaches.       Objective    BP 101/63   Pulse 70   Ht 5\' 4"  (1.626 m)   Wt 278 lb (126.1 kg)   BMI 47.72 kg/m    Physical Exam Vitals reviewed.  Constitutional:      Appearance: She is not ill-appearing.  HENT:     Head: Normocephalic.  Eyes:     Conjunctiva/sclera: Conjunctivae normal.  Cardiovascular:     Rate and Rhythm: Normal rate.  Pulmonary:     Effort: Pulmonary effort is normal. No respiratory distress.  Neurological:     Mental Status: She is alert and oriented to person, place, and time.  Psychiatric:        Mood and Affect: Mood normal.        Behavior: Behavior normal.     Results for orders placed or performed in visit on 05/15/23  POCT urinalysis dipstick  Result Value Ref Range   Color, UA yellow yellow   Clarity, UA clear clear   Glucose, UA =250 (A) negative mg/dL   Bilirubin, UA negative negative   Ketones, POC UA negative negative mg/dL   Spec Grav, UA <=4.166 (A) 1.010 - 1.025   Blood, UA large (A) negative   pH, UA 6.0 5.0 - 8.0   Protein Ur, POC negative negative mg/dL   Urobilinogen, UA 0.2 0.2 or 1.0 E.U./dL   Nitrite, UA Negative Negative   Leukocytes, UA Negative Negative    Assessment & Plan    Urinary frequency -     POCT urinalysis dipstick -     Urine Culture  Type 2 diabetes mellitus with hyperglycemia, without long-term current use of insulin (HCC) -     Ozempic (0.25 or 0.5 MG/DOSE); Inject 0.25 mg into the skin once a week. For four weeks, and  then increase to 0.5 mg weekly  Dispense: 3 mL; Refill: 1  Seasonal allergies -     Olopatadine HCl; Place 1 drop into both eyes 2 (two) times daily.  Dispense: 5 mL; Refill: 1 -     Fluticasone Propionate; Place 2 sprays into both nostrils daily.  Dispense: 16 g; Refill: 6   UA w/ blood likely vaginal, will run culture given symptoms.   Mounjaro was denied, rx ozempic instead. Advised the different in admin.   Recommending daily antihistamines use, rx eye drops and nasal spray for allergic symptoms  Return if symptoms worsen or fail to improve.       Trenton Frock, PA-C  [facility] at [facility] [phone] (phone) [phone] (fax)  [facility]

## 2023-05-16 ENCOUNTER — Other Ambulatory Visit (HOSPITAL_COMMUNITY): Payer: Self-pay

## 2023-05-16 ENCOUNTER — Telehealth: Payer: Self-pay | Admitting: Pharmacy Technician

## 2023-05-16 LAB — URINE CULTURE
MICRO NUMBER:: 16395222
Result:: NO GROWTH
SPECIMEN QUALITY:: ADEQUATE

## 2023-05-16 NOTE — Telephone Encounter (Signed)
 Pharmacy Patient Advocate Encounter   Received notification from CoverMyMeds that prior authorization for Ozempic  (0.25 or 0.5 MG/DOSE) 2MG /3ML pen-injectors is required/requested.   Insurance verification completed.   The patient is insured through [facility] .   Per test claim: PA required; PA started via CoverMyMeds. KEY [id number] . Please see clinical question(s) below that I am not finding the answer to in her chart and advise.  Medicaid requires a trail/failure to metformin before she can be approved for Ozempic . Please advise.

## 2023-05-17 ENCOUNTER — Encounter: Payer: Self-pay | Admitting: Physician Assistant

## 2023-05-21 ENCOUNTER — Other Ambulatory Visit: Payer: Self-pay | Admitting: Physician Assistant

## 2023-05-21 MED ORDER — WEGOVY 0.25 MG/0.5ML ~~LOC~~ SOAJ: 0.2500 mg | SUBCUTANEOUS | 0 refills | Status: DC

## 2023-05-21 NOTE — Telephone Encounter (Signed)
 Can we please have an update to below?

## 2023-05-22 ENCOUNTER — Telehealth: Payer: Self-pay

## 2023-05-22 ENCOUNTER — Other Ambulatory Visit (HOSPITAL_COMMUNITY): Payer: Self-pay

## 2023-05-22 NOTE — Telephone Encounter (Signed)
 Pharmacy Patient Advocate Encounter   Received notification from  Resubmitting previous PA denial  that prior authorization for [facility] is required/requested.   Insurance verification completed.   The patient is insured through [facility] .   Per test claim: PA required; PA submitted to above mentioned insurance via CoverMyMeds Key/confirmation #/EOC [id number] Status is pending   NOTE PT IS DIABETIC, ATTEMPTING TO BYPASS METFORMIN STEP THERAPY PER PROVIDER. IF DENIED, WILL ATTEMPT NEW SUBMISSION FOR OZEMPIC  ARGUING MEDICAL NECESSITY DUE TO PT'S PRIOR HEART FAILURE AS METFORMIN IS NOT INDICATED FOR CV RISK REDUCTION, WHILE OZEMPIC /WEGOVY IS.

## 2023-05-22 NOTE — Telephone Encounter (Signed)
 Pharmacy Patient Advocate Encounter   Received notification from Onbase that prior authorization for [facility] 0.25MG /0.5ML auto-injectors is required/requested.   Insurance verification completed.   The patient is insured through [facility] .   Per test claim: PA required; PA submitted to above mentioned insurance via CoverMyMeds Key/confirmation #/EOC [id number] Status is pending

## 2023-05-22 NOTE — Telephone Encounter (Signed)
 Pharmacy Patient Advocate Encounter  Received notification from [facility] that Prior Authorization for Wegovy 0.25MG /0.5ML auto-injectors  has been DENIED.  Full denial letter will be uploaded to the media tab. See denial reason below.      RESUBMITTING IN SEPARATE ENCOUNTER BEFORE NOTIFYING CLINIC

## 2023-05-23 ENCOUNTER — Other Ambulatory Visit (HOSPITAL_COMMUNITY): Payer: Self-pay

## 2023-05-23 NOTE — Telephone Encounter (Signed)
 Pharmacy Patient Advocate Encounter  Received notification from [facility] that Prior Authorization for Anna Chapman has been APPROVED from 05/22/2023 to 11/18/2023   PA #/Case ID/Reference #: [id number]

## 2023-05-27 ENCOUNTER — Ambulatory Visit: Admitting: Obstetrics and Gynecology

## 2023-05-31 ENCOUNTER — Other Ambulatory Visit (HOSPITAL_COMMUNITY): Payer: Self-pay

## 2023-06-03 ENCOUNTER — Other Ambulatory Visit (HOSPITAL_COMMUNITY): Payer: Self-pay

## 2023-06-03 ENCOUNTER — Telehealth: Payer: Self-pay | Admitting: Pharmacy Technician

## 2023-06-03 NOTE — Telephone Encounter (Signed)
 Pharmacy Patient Advocate Encounter  Received notification from [facility] that Prior Authorization for Farxiga  has been APPROVED from 06/03/23 to 06/02/24. Ran test claim, Copay is $4.00- 3 months. This test claim was processed through [facility]- copay amounts may vary at other pharmacies due to pharmacy/plan contracts, or as the patient moves through the different stages of their insurance plan.   PA #/Case ID/Reference #: [id number]

## 2023-06-03 NOTE — Telephone Encounter (Addendum)
 Pharmacy Patient Advocate Encounter   Received notification from CoverMyMeds that prior authorization for Farxiga  is required/requested.   Insurance verification completed.   The patient is insured through [facility] .   Per test claim: PA required; PA submitted to above mentioned insurance via CoverMyMeds Key/confirmation #/EOC [id number] Status is pending

## 2023-06-09 NOTE — Progress Notes (Unsigned)
 Cardiology Office Note:   Date:  06/12/2023  ID:  Anna Chapman, DOB [birth year], MRN [id number] PCP:  Trenton Frock, PA-C  [facility] Providers Cardiologist:  Alyssa Backbone, MD Referring MD: Trenton Frock, PA-C  Chief Complaint/Reason for Referral: Heart failure ASSESSMENT:    1. NICM (nonischemic cardiomyopathy) (HCC)   2. Coronary artery disease involving native coronary artery of native heart without angina pectoris   3. Hyperlipidemia LDL goal <70   4. Primary hypertension   5. BMI 45.0-49.9, adult (HCC)   6. Other chest pain      PLAN:   In order of problems listed above: NICM: Reassuring evaluation; continue Farxiga  10, Toprol  12.5, Entresto  high-dose, spironolactone  25 and torsemide  40 mg daily. CAD: Mild; continue aspirin  81, Zetia  10, and trial Crestor 5 mg. Hyperlipidemia: Continue Zetia  10 mg and trial Crestor 5 mg; check lipid panel, LFTs, LP(a) and 2 months. Hypertension: Continue high dose Entresto , spironolactone  25, Toprol  12.5. Elevated BMI: Continue semaglutide  0.25 mg weekly. Chest pain: Atypical and patient tells me when it bothers her her chest is tender to palpation.  Cardiac evaluation has been reassuring.   I spent 33 minutes reviewing all clinical data during and prior to this visit including all relevant imaging studies, laboratories, clinical information from other health systems and prior notes from both Cardiology and other specialties, interviewing the patient, conducting a complete physical examination, and coordinating care in order to formulate a comprehensive and personalized evaluation and treatment plan.   Dispo:  Return in about 6 months (around 12/13/2023).      Medication Adjustments/Labs and Tests Ordered: Current medicines are reviewed at length with the patient today.  Concerns regarding medicines are outlined above.  The following changes have been made:     Labs/tests ordered: Orders Placed This Encounter  Procedures    Lipoprotein A (LPA)   Lipid panel   Hepatic function panel    Medication Changes: Meds ordered this encounter  Medications   rosuvastatin (CRESTOR) 5 MG tablet    Sig: Take 1 tablet (5 mg total) by mouth daily.    Dispense:  90 tablet    Refill:  3    Current medicines are reviewed at length with the patient today.  The patient does not have concerns regarding medicines.    History of Present Illness:      FOCUSED PROBLEM LIST:   BMI 49 Hypertension NICM EF 35 to 40% TTE [facility] 2024 EF 50 to 55% no valve disease January 2025 Coronary CTA mild disease CAC 0 February 2025 CMR EF 58% RVEF 59% no LGE April 2025 CAD Mild, CAC 0 coronary CTA February 2025 Hyperlipidemia Intolerant of Lipitor >> itching GERD  November 2024: The patient is a 43 year old female with the above listed medical problems referred for recommendations regarding heart failure.  Apparently the patient was admitted to an outside hospital in February with acute on chronic systolic heart failure.  An echocardiogram at that time demonstrated ejection fraction 35 to 40% with left ventricular hypertrophy.  She was diuresed and Entresto  and Aldactone  were started.  She did not undergo cardiac catheterization or coronary CTA imaging.  She was seen recently by her primary care provider.  A proBNP was drawn which was 53 and not elevated.  She is here to establish care.    The patient has issues with chest discomfort.  She tells me when she wakes up she develops chest discomfort.  It is tender to palpation.  She does not report any chest  discomfort when she exerts herself.  She denies any shortness of breath.  She occasionally develops some peripheral edema but this seems to be better after night sleep.  She fortunately does not required any emergency room visits or hospitalizations.  She has had no syncope, palpitations, paroxysmal nocturnal dyspnea, orthopnea.  She is compliant with her medical therapy.  Plan obtain  echocardiogram, increase Entresto  to high-dose, discontinue bisoprolol/hydrochlorothiazide and start Toprol  12.5 mg and Farxiga ; check BMP.  May 2025:  Patient consents to use of AI scribe. In the interim the patient had an echocardiogram which showed ejection fraction 50-55 percent.  She is referred for coronary CTA which demonstrated no severe obstructive disease.  Cardiac MRI showed normal biventricular function with no significant valvular abnormalities myocardial LGE.  The patient tells me she went to the emergency department for atypical chest pain twice.  Apparently her chest began hurting.  She felt it was a sharp pain.  Rubbing her chest made the pain worse.  In terms of other symptoms she denies any shortness of breath, presyncope, palpitations, paroxysmal nocturnal dyspnea, orthopnea.  She has had no issues with her medications.  She reports compliance with all of her medical therapy.         Current Medications: Current Meds  Medication Sig   aspirin  EC 81 MG tablet Take 1 tablet (81 mg total) by mouth daily. Swallow whole.   dapagliflozin  propanediol (FARXIGA ) 10 MG TABS tablet Take 1 tablet (10 mg total) by mouth daily.   Drospirenone  (SLYND ) 4 MG TABS Take 1 tablet (4 mg total) by mouth daily.   ezetimibe  (ZETIA ) 10 MG tablet Take 1 tablet (10 mg total) by mouth daily.   ferrous sulfate  325 (65 FE) MG EC tablet Take 1 tablet (325 mg total) by mouth daily.   fluticasone  (FLONASE ) 50 MCG/ACT nasal spray Place 2 sprays into both nostrils daily.   metoprolol  succinate (TOPROL  XL) 25 MG 24 hr tablet Take 0.5 tablets (12.5 mg total) by mouth at bedtime.   olopatadine  (PATADAY ) 0.1 % ophthalmic solution Place 1 drop into both eyes 2 (two) times daily.   pantoprazole  (PROTONIX ) 40 MG tablet TAKE 1 TABLET BY MOUTH TWICE DAILY 30  MINUTES  BEFORE  BREAKFAST  AND  DINNER.   rosuvastatin (CRESTOR) 5 MG tablet Take 1 tablet (5 mg total) by mouth daily.   sacubitril -valsartan  (ENTRESTO )  97-103 MG Take 1 tablet by mouth 2 (two) times daily.   Semaglutide -Weight Management (WEGOVY ) 0.25 MG/0.5ML SOAJ Inject 0.25 mg into the skin once a week. For 4 weeks.   spironolactone  (ALDACTONE ) 25 MG tablet Take 1 tablet (25 mg total) by mouth daily.   sucralfate  (CARAFATE ) 1 GM/10ML suspension Take 10 mLs (1 g total) by mouth in the morning, at noon, and at bedtime.   torsemide  (DEMADEX ) 20 MG tablet Take 2 tablets (40 mg total) by mouth daily.     Review of Systems:   Please see the history of present illness.    All other systems reviewed and are negative.     EKGs/Labs/Other Test Reviewed:   EKG: EKG performed November 2024 that I personally reviewed demonstrates sinus rhythm with nonspecific ST and T wave changes  EKG Interpretation Date/Time:    Ventricular Rate:    PR Interval:    QRS Duration:    QT Interval:    QTC Calculation:   R Axis:      Text Interpretation:           Risk Assessment/Calculations:  Physical Exam:   VS:  BP 106/62   Pulse 73   Ht 5\' 5"  (1.651 m)   Wt 268 lb (121.6 kg)   SpO2 98%   BMI 44.60 kg/m        Wt Readings from Last 3 Encounters:  06/12/23 268 lb (121.6 kg)  05/15/23 278 lb (126.1 kg)  04/23/23 283 lb 6.4 oz (128.5 kg)      GENERAL:  No apparent distress, AOx3 HEENT:  No carotid bruits, +2 carotid impulses, no scleral icterus CAR: RRR no murmurs, gallops, rubs, or thrills RES:  Clear to auscultation bilaterally ABD:  Soft, nontender, nondistended, positive bowel sounds x 4 VASC:  +2 radial pulses, +2 carotid pulses NEURO:  CN 2-12 grossly intact; motor and sensory grossly intact PSYCH:  No active depression or anxiety EXT:  No edema, ecchymosis, or cyanosis  Signed, Tailor Westfall K Marysa Wessner, MD  06/12/2023 8:26 AM    [facility] [address] [place], [place], [place]  [address] Phone: [phone]; Fax: [phone]   Note:  This document was prepared using Dragon voice recognition software  and may include unintentional dictation errors.

## 2023-06-12 ENCOUNTER — Encounter: Payer: Self-pay | Admitting: Internal Medicine

## 2023-06-12 ENCOUNTER — Ambulatory Visit: Payer: Commercial Managed Care - HMO | Attending: Internal Medicine | Admitting: Internal Medicine

## 2023-06-12 ENCOUNTER — Other Ambulatory Visit: Payer: Self-pay | Admitting: Physician Assistant

## 2023-06-12 VITALS — BP 106/62 | HR 73 | Ht 65.0 in | Wt 268.0 lb

## 2023-06-12 DIAGNOSIS — Z6841 Body Mass Index (BMI) 40.0 and over, adult: Secondary | ICD-10-CM | POA: Diagnosis present

## 2023-06-12 DIAGNOSIS — I10 Essential (primary) hypertension: Secondary | ICD-10-CM | POA: Diagnosis not present

## 2023-06-12 DIAGNOSIS — E1165 Type 2 diabetes mellitus with hyperglycemia: Secondary | ICD-10-CM

## 2023-06-12 DIAGNOSIS — E785 Hyperlipidemia, unspecified: Secondary | ICD-10-CM | POA: Insufficient documentation

## 2023-06-12 DIAGNOSIS — I2510 Atherosclerotic heart disease of native coronary artery without angina pectoris: Secondary | ICD-10-CM | POA: Diagnosis present

## 2023-06-12 DIAGNOSIS — I428 Other cardiomyopathies: Secondary | ICD-10-CM | POA: Diagnosis present

## 2023-06-12 DIAGNOSIS — R0789 Other chest pain: Secondary | ICD-10-CM | POA: Insufficient documentation

## 2023-06-12 MED ORDER — ROSUVASTATIN CALCIUM 5 MG PO TABS: 5.0000 mg | ORAL_TABLET | Freq: Every day | ORAL | 3 refills | Status: DC

## 2023-06-12 MED ORDER — WEGOVY 0.5 MG/0.5ML ~~LOC~~ SOAJ: 0.5000 mg | SUBCUTANEOUS | 3 refills | Status: DC

## 2023-06-12 NOTE — Patient Instructions (Signed)
 Medication Instructions:  Your physician has recommended you make the following change in your medication:  1.) start rosuvastatin  (Crestor ) 5 mg - take one tablet daily  *If you need a refill on your cardiac medications before your next appointment, please call your pharmacy*  Lab Work: Return for blood work in 8 weeks (end of July) - lipids, liver function, Lp(a)   Testing/Procedures: none  Follow-Up: At [facility], you and your health needs are our priority.  As part of our continuing mission to provide you with exceptional heart care, our providers are all part of one team.  This team includes your primary Cardiologist (physician) and Advanced Practice Providers or APPs (Physician Assistants and Nurse Practitioners) who all work together to provide you with the care you need, when you need it.  Your next appointment:   6 month(s)  Provider:   One of our Advanced Practice Providers (APPs): Melita Springer, PA-C  Friddie Jetty, NP Evaline Hill, NP  Theotis Flake, PA-C Lawana Pray, NP  Willis Harter, PA-C Lovette Rud, PA-C  [place], [place] Charles Connor, NP  Marlana Silvan, NP Marcie Sever, PA-C  Laquita Plant, PA-C    Dayna Dunn, PA-C  Marlyse Single, PA-C Palmer Bobo, NP Katlyn West, NP Callie Goodrich, PA-C  Evan Williams, PA-C Sheng Haley, PA-C  Xika Zhao, NP Kathleen Johnson, PA-C

## 2023-06-21 ENCOUNTER — Other Ambulatory Visit: Payer: Self-pay

## 2023-06-21 ENCOUNTER — Emergency Department (HOSPITAL_BASED_OUTPATIENT_CLINIC_OR_DEPARTMENT_OTHER)

## 2023-06-21 ENCOUNTER — Emergency Department (HOSPITAL_BASED_OUTPATIENT_CLINIC_OR_DEPARTMENT_OTHER)
Admission: EM | Admit: 2023-06-21 | Discharge: 2023-06-21 | Disposition: A | Discharge status: Home / Self Care | Attending: Emergency Medicine | Admitting: Emergency Medicine

## 2023-06-21 DIAGNOSIS — D649 Anemia, unspecified: Secondary | ICD-10-CM | POA: Diagnosis not present

## 2023-06-21 DIAGNOSIS — I110 Hypertensive heart disease with heart failure: Secondary | ICD-10-CM | POA: Insufficient documentation

## 2023-06-21 DIAGNOSIS — E119 Type 2 diabetes mellitus without complications: Secondary | ICD-10-CM | POA: Insufficient documentation

## 2023-06-21 DIAGNOSIS — R7989 Other specified abnormal findings of blood chemistry: Secondary | ICD-10-CM | POA: Diagnosis not present

## 2023-06-21 DIAGNOSIS — M79601 Pain in right arm: Secondary | ICD-10-CM

## 2023-06-21 DIAGNOSIS — Z7982 Long term (current) use of aspirin: Secondary | ICD-10-CM | POA: Diagnosis not present

## 2023-06-21 DIAGNOSIS — I509 Heart failure, unspecified: Secondary | ICD-10-CM | POA: Diagnosis not present

## 2023-06-21 LAB — CBC WITH DIFFERENTIAL/PLATELET
Abs Immature Granulocytes: 0.05 10*3/uL (ref 0.00–0.07)
Basophils Absolute: 0.1 10*3/uL (ref 0.0–0.1)
Basophils Relative: 1 %
Eosinophils Absolute: 0.3 10*3/uL (ref 0.0–0.5)
Eosinophils Relative: 3 %
HCT: 29.9 % — ABNORMAL LOW (ref 36.0–46.0)
Hemoglobin: 9.8 g/dL — ABNORMAL LOW (ref 12.0–15.0)
Immature Granulocytes: 1 %
Lymphocytes Relative: 23 %
Lymphs Abs: 2.3 10*3/uL (ref 0.7–4.0)
MCH: 28.3 pg (ref 26.0–34.0)
MCHC: 32.8 g/dL (ref 30.0–36.0)
MCV: 86.4 fL (ref 80.0–100.0)
Monocytes Absolute: 0.8 10*3/uL (ref 0.1–1.0)
Monocytes Relative: 8 %
Neutro Abs: 6.6 10*3/uL (ref 1.7–7.7)
Neutrophils Relative %: 64 %
Platelets: 380 10*3/uL (ref 150–400)
RBC: 3.46 MIL/uL — ABNORMAL LOW (ref 3.87–5.11)
RDW: 15.3 % (ref 11.5–15.5)
WBC: 10.1 10*3/uL (ref 4.0–10.5)
nRBC: 0 % (ref 0.0–0.2)

## 2023-06-21 LAB — BASIC METABOLIC PANEL WITH GFR
Anion gap: 12 (ref 5–15)
BUN: 16 mg/dL (ref 6–20)
CO2: 24 mmol/L (ref 22–32)
Calcium: 8.6 mg/dL — ABNORMAL LOW (ref 8.9–10.3)
Chloride: 103 mmol/L (ref 98–111)
Creatinine, Ser: 1.14 mg/dL — ABNORMAL HIGH (ref 0.44–1.00)
GFR, Estimated: >60 mL/min (ref >60)
Glucose, Bld: 109 mg/dL — ABNORMAL HIGH (ref 70–99)
Potassium: 3.5 mmol/L (ref 3.5–5.1)
Sodium: 139 mmol/L (ref 135–145)

## 2023-06-21 LAB — URINALYSIS, ROUTINE W REFLEX MICROSCOPIC
Bilirubin Urine: NEGATIVE
Glucose, UA: >=500 mg/dL — AB
Hgb urine dipstick: NEGATIVE
Ketones, ur: NEGATIVE mg/dL
Leukocytes,Ua: NEGATIVE
Nitrite: NEGATIVE
Protein, ur: NEGATIVE mg/dL
Specific Gravity, Urine: 1.015 (ref 1.005–1.030)
pH: 6 (ref 5.0–8.0)

## 2023-06-21 LAB — HCG, SERUM, QUALITATIVE: Preg, Serum: NEGATIVE

## 2023-06-21 LAB — URINALYSIS, MICROSCOPIC (REFLEX)

## 2023-06-21 LAB — TROPONIN T, HIGH SENSITIVITY: Troponin T High Sensitivity: <15 ng/L (ref <19)

## 2023-06-21 MED ORDER — KETOROLAC TROMETHAMINE 15 MG/ML IJ SOLN
15.0000 mg | Freq: Once | INTRAMUSCULAR | Status: AC
  Administered 2023-06-21: 15 mg via INTRAVENOUS
  Filled 2023-06-21: qty 1

## 2023-06-21 NOTE — Discharge Instructions (Addendum)
 You were seen today for right arm pain, likely suspected to be from lateral epicondylitis.  You need to make sure that you rest, ice, compress the area as well as elevate to help keep it from swelling.  You can use Tylenol  and ibuprofen for pain management at home  Take Tylenol  (acetominophen)  650mg  every 4-6 hours, as needed for pain or fever. Do not take more than 4,000 mg in a 24-hour period. As this may cause liver damage. While this is rare, if you begin to develop yellowing of the skin or eyes, stop taking and return to ER immediately.  Take Ibuprofen 400mg  every 4-6 hours for pain or fever, not exceeding 3,200 mg per day as more than 3,200mg  can cause Stomach irritation, dizziness, kidney issues with long-term use.  Recommend you also use a counterforce brace for continued relief.  Follow-up with PCP for persistent symptoms and return to ED for any new or worsening symptoms which include chest pain, shortness of breath, abdominal pain, vomiting, confusion, one-sided weakness.

## 2023-06-21 NOTE — ED Triage Notes (Signed)
 Pt has pain and swelling in right arm, denies trauma. Pt was sent here by urgent care to check for blood clot in arm. Pt also complains of dizziness with ambulation.

## 2023-06-21 NOTE — ED Provider Notes (Signed)
 [place] [facility] Provider Note   CSN: [id number] Arrival date & time: 06/21/23  1738     History  Chief Complaint  Patient presents with   Arm Pain   Dizziness    Dizziness with ambulation     Anna Chapman is a 43 y.o. female.   Arm Pain  Dizziness Patient is a 43 year old female presents the ED today with complaints of right arm pain and swelling that began yesterday morning when she awoke.  Notes that she denies any trauma, injury to the area, heavy lifting.  Seen by urgent care today and was sent to the ED to rule out DVT in right arm.  States that she is also felt dizzy starting today when she is ambulating.  Reporting lightheadedness denying blurry vision, vertigo.  Denies fever, headache, cough, chest pain, shortness of breath, abdominal pain, nausea, vomiting, diarrhea, dysuria, lower leg swelling.     Home Medications Prior to Admission medications   Medication Sig Start Date End Date Taking? Authorizing Provider  aspirin  EC 81 MG tablet Take 1 tablet (81 mg total) by mouth daily. Swallow whole. 03/21/23   Thukkani, Arun K, MD  bisoprolol-hydrochlorothiazide (ZIAC) 5-6.25 MG tablet Take 1 tablet by mouth daily. Patient not taking: Reported on 06/12/2023 04/23/23   [provider]  dapagliflozin  propanediol (FARXIGA ) 10 MG TABS tablet Take 1 tablet (10 mg total) by mouth daily. 12/07/22   Thukkani, Arun K, MD  Drospirenone  (SLYND ) 4 MG TABS Take 1 tablet (4 mg total) by mouth daily. 04/02/23   Zelma Hidden, FNP  ezetimibe  (ZETIA ) 10 MG tablet Take 1 tablet (10 mg total) by mouth daily. 04/23/23   Trenton Frock, PA-C  ferrous sulfate  325 (65 FE) MG EC tablet Take 1 tablet (325 mg total) by mouth daily. 02/28/23   Trenton Frock, PA-C  fluticasone  (FLONASE ) 50 MCG/ACT nasal spray Place 2 sprays into both nostrils daily. 05/15/23   Trenton Frock, PA-C  metoprolol  succinate (TOPROL  XL) 25 MG 24 hr tablet Take 0.5 tablets  (12.5 mg total) by mouth at bedtime. 12/07/22   Thukkani, Arun K, MD  olopatadine  (PATADAY ) 0.1 % ophthalmic solution Place 1 drop into both eyes 2 (two) times daily. 05/15/23   Trenton Frock, PA-C  pantoprazole  (PROTONIX ) 40 MG tablet TAKE 1 TABLET BY MOUTH TWICE DAILY 30  MINUTES  BEFORE  BREAKFAST  AND  DINNER. 05/13/23   Kennedy-Smith, Colleen M, NP  rosuvastatin  (CRESTOR ) 5 MG tablet Take 1 tablet (5 mg total) by mouth daily. 06/12/23   Thukkani, Arun K, MD  sacubitril -valsartan  (ENTRESTO ) 97-103 MG Take 1 tablet by mouth 2 (two) times daily. 12/07/22   Thukkani, Arun K, MD  Semaglutide -Weight Management (WEGOVY ) 0.25 MG/0.5ML SOAJ Inject 0.25 mg into the skin once a week. For 4 weeks. 05/21/23   Trenton Frock, PA-C  Semaglutide -Weight Management (WEGOVY ) 0.5 MG/0.5ML SOAJ Inject 0.5 mg into the skin once a week. Start after 4 weeks of 0.25 mg 06/12/23   Drubel, Heidi Llamas, PA-C  spironolactone  (ALDACTONE ) 25 MG tablet Take 1 tablet (25 mg total) by mouth daily. 12/07/22   Thukkani, Arun K, MD  sucralfate  (CARAFATE ) 1 GM/10ML suspension Take 10 mLs (1 g total) by mouth in the morning, at noon, and at bedtime. 05/06/23   Trenton Frock, PA-C  torsemide  (DEMADEX ) 20 MG tablet Take 2 tablets (40 mg total) by mouth daily. 12/07/22   Thukkani, Arun K, MD      Allergies    Other  and Lipitor [atorvastatin ]    Review of Systems   Review of Systems  Musculoskeletal:  Positive for arthralgias and myalgias.  Neurological:  Positive for dizziness.  All other systems reviewed and are negative.   Physical Exam Updated Vital Signs BP 128/79 (BP Location: Left Arm)   Pulse 82   Temp 97.8 F (36.6 C) (Oral)   Resp 18   Ht 5\' 5"  (1.651 m)   Wt 123.8 kg   LMP 05/26/2023 (Approximate)   SpO2 97%   BMI 45.43 kg/m  Physical Exam Vitals and nursing note reviewed.  Constitutional:      General: She is not in acute distress.    Appearance: Normal appearance. She is not ill-appearing or diaphoretic.   HENT:     Head: Normocephalic and atraumatic.  Eyes:     General:        Right eye: No discharge.        Left eye: No discharge.     Extraocular Movements: Extraocular movements intact.     Conjunctiva/sclera: Conjunctivae normal.  Cardiovascular:     Rate and Rhythm: Normal rate and regular rhythm.     Pulses: Normal pulses.     Heart sounds: Normal heart sounds. No murmur heard.    No friction rub. No gallop.  Pulmonary:     Effort: Pulmonary effort is normal. No respiratory distress.     Breath sounds: Normal breath sounds. No stridor. No wheezing, rhonchi or rales.  Chest:     Chest wall: No tenderness.  Abdominal:     General: Abdomen is flat. There is no distension.     Palpations: Abdomen is soft.     Tenderness: There is no abdominal tenderness. There is no guarding.  Musculoskeletal:        General: Swelling and tenderness (Tenderness to ovation and swelling noted over the right arm over the brachial radialis muscle and right bicep muscle) present.     Cervical back: Normal range of motion. No rigidity.  Skin:    General: Skin is warm and dry.     Findings: No bruising, erythema or lesion.  Neurological:     General: No focal deficit present.     Mental Status: She is alert and oriented to person, place, and time. Mental status is at baseline.     Sensory: No sensory deficit.     Motor: No weakness.     Gait: Gait normal.  Psychiatric:        Mood and Affect: Mood normal.     ED Results / Procedures / Treatments   Labs (all labs ordered are listed, but only abnormal results are displayed) Labs Reviewed  CBC WITH DIFFERENTIAL/PLATELET - Abnormal; Notable for the following components:      Result Value   RBC 3.46 (*)    Hemoglobin 9.8 (*)    HCT 29.9 (*)    All other components within normal limits  BASIC METABOLIC PANEL WITH GFR - Abnormal; Notable for the following components:   Glucose, Bld 109 (*)    Creatinine, Ser 1.14 (*)    Calcium  8.6 (*)    All  other components within normal limits  URINALYSIS, ROUTINE W REFLEX MICROSCOPIC - Abnormal; Notable for the following components:   Glucose, UA >=500 (*)    All other components within normal limits  URINALYSIS, MICROSCOPIC (REFLEX) - Abnormal; Notable for the following components:   Bacteria, UA RARE (*)    All other components within normal limits  HCG,  SERUM, QUALITATIVE  TROPONIN T, HIGH SENSITIVITY    EKG None  Radiology DG Chest 2 View Result Date: 06/21/2023 CLINICAL DATA:  Dizziness. EXAM: CHEST - 2 VIEW COMPARISON:  Most recent radiograph 05/06/2023. Most recent chest CT 02/20/2023 FINDINGS: Lung volumes are low.The cardiomediastinal contours are normal. The lungs are clear. Pulmonary vasculature is normal. No consolidation, pleural effusion, or pneumothorax. No acute osseous abnormalities are seen. IMPRESSION: Low lung volumes without acute chest finding. Electronically Signed   By: Chadwick Colonel M.D.   On: 06/21/2023 20:49   US  Venous Img Upper Uni Right(DVT) Result Date: 06/21/2023 CLINICAL DATA:  Right focal forearm pain and swelling near the elbow EXAM: Right UPPER EXTREMITY VENOUS DOPPLER ULTRASOUND TECHNIQUE: Gray-scale sonography with graded compression, as well as color Doppler and duplex ultrasound were performed to evaluate the upper extremity deep venous system from the level of the subclavian vein and including the jugular, axillary, basilic, radial, ulnar and upper cephalic vein. Spectral Doppler was utilized to evaluate flow at rest and with distal augmentation maneuvers. COMPARISON:  None Available. FINDINGS: Contralateral Subclavian Vein: Respiratory phasicity is normal and symmetric with the symptomatic side. No evidence of thrombus. Normal compressibility. Internal Jugular Vein: No evidence of thrombus. Normal compressibility, respiratory phasicity and response to augmentation. Subclavian Vein: No evidence of thrombus. Normal compressibility, respiratory phasicity and  response to augmentation. Axillary Vein: No evidence of thrombus. Normal compressibility, respiratory phasicity and response to augmentation. Cephalic Vein: No evidence of thrombus. Normal compressibility, respiratory phasicity and response to augmentation. Basilic Vein: No evidence of thrombus. Normal compressibility, respiratory phasicity and response to augmentation. Brachial Veins: No evidence of thrombus. Normal compressibility, respiratory phasicity and response to augmentation. Radial Veins: No evidence of thrombus. Normal compressibility, respiratory phasicity and response to augmentation. Ulnar Veins: No evidence of thrombus. Normal compressibility, respiratory phasicity and response to augmentation. Venous Reflux:  None visualized. Other Findings: In the right lateral forearm at the elbow in the area of pain there is a small 1.2 x 0.3 x 0.8 cm fluid collection. Significant associated vascularity or hyperemia. IMPRESSION: 1. No evidence of DVT within the right upper extremity. 2. Small 1.2 cm fluid collection in the right lateral forearm at the elbow in the area of pain. Correlate for lateral epicondylitis. Electronically Signed   By: Rozell Cornet M.D.   On: 06/21/2023 19:49    Procedures Procedures   Medications Ordered in ED Medications  ketorolac  (TORADOL ) 15 MG/ML injection 15 mg (15 mg Intravenous Given 06/21/23 1839)    ED Course/ Medical Decision Making/ A&P                               PERC Score: 0, PERC Score Interpretation: No need for further workup, as <2% chance of PE.  If no criteria are positive and clinicians pre-test probability is <15%, PERC Rule criteria are satisfied Medical Decision Making  This patient is a 43 year old female who presents to the ED for concern of right arm pain over the lateral epicondyle, notably painful with supination and with forced extension.  Sent over by PCP for DVT rule out.  Notes that she is felt mildly dizzy recently but is not  accompanied with any chest pain or shortness of breath.  States that she has not been drinking much today.  On physical exam, patient is in no acute distress, afebrile, alert and orient x 4, speaking in full sentences, nontachypneic, nontachycardic.  Notably painful with  supination and force extension to the right elbow and tender over the biceps and lateral aspect of the elbow.  No other leg edema.  LCTAB.  No murmurs noted.  Unremarkable exam otherwise.  Lab work and imaging were reassuring with no clot or major malady, patient notably had an elevated creatinine of 1.14, similar to previous 4 months ago and did also note low lung volumes bilaterally in her chest x-ray.  Ultrasound did reveal likely epicondylitis of the elbow.  Suspect this is the cause as patient has presentation with similar symptoms despite no memory of overuse.  Provided Toradol  and patient reevaluated and says symptoms are greatly improved.  Will provide Ace wrap and have her RICE at home.  Will also have her follow-up with PCP for any persistent symptoms.  And also follow-up with the lung volumes.  Suspect that the mildly elevated creatinine likely due to dehydration.  And the hemoglobin is near baseline with compared to previous.  Will have her manage pain with Tylenol  and Profen at home.  Patient dizziness has since resolved since talking to the patient, and with no other signs symptom and not suspecting PE or ACS at this time we will have patient follow-up with PCP for symptoms.  Patient vital signs have remained stable throughout the course of patient's time in the ED. Low suspicion for any other emergent pathology at this time. I believe this patient is safe to be discharged. Provided strict return to ER precautions. Patient expressed agreement and understanding of plan. All questions were answered.  Differential diagnoses prior to evaluation: The emergent differential diagnosis includes, but is not limited to, lateral  epicondylitis, DVT, muscle strain, ACS, PE, pneumonia. This is not an exhaustive differential.   Past Medical History / Co-morbidities / Social History: Chronic heart failure, GERD, HTN, type 2 diabetes.  Additional history: Chart reviewed. Pertinent results include: Seen by urgent care today and sent to the ED today for concerns of DVT.  Lab Tests/Imaging studies: I personally interpreted labs/imaging and the pertinent results include:   CBC notes a anemia with hemoglobin of 9.8, similar to previous BMP notes a mild elevated creatinine of 1.14, similar to previous UA shows glucose and urine Troponin remarkable hCG negative Ultrasound shows likely epicondylitis but no sign of DVT Chest x-ray showed low lung volumes but otherwise unremarkable. .I agree with the radiologist interpretation.    Medications: I ordered medication including Toradol .  I have reviewed the patients home medicines and have made adjustments as needed.  Disposition: After consideration of the diagnostic results and the patients response to treatment, I feel that the patient would benefit from discharge and treatment as above.   emergency department workup does not suggest an emergent condition requiring admission or immediate intervention beyond what has been performed at this time. The plan is: Follow-up with PCP for pain in arm and or for persistent dizziness, return to ED for new or worsening symptoms. The patient is safe for discharge and has been instructed to return immediately for worsening symptoms, change in symptoms or any other concerns.   Final Clinical Impression(s) / ED Diagnoses Final diagnoses:  Right arm pain    Rx / DC Orders ED Discharge Orders     None         Vevelyn Gowers 06/21/23 2130    Hershel Los, MD 06/21/23 2342

## 2023-07-01 ENCOUNTER — Telehealth: Payer: Self-pay | Admitting: Nurse Practitioner

## 2023-07-01 NOTE — Telephone Encounter (Signed)
 Linda/Dottie, please contact the patient and schedule her for an office visit as she was last seen in office 6 months ago 01/01/2019 and an EGD and possible colonoscopy were recommended after she completed an ECHO 01/2023.  If she continues to have atypical chest pain, we will need to request an official cardiac clearance, to sort through at the time of her office follow-up visit.  Thank you.

## 2023-07-01 NOTE — Telephone Encounter (Signed)
 Pt has been scheduled to see Everett Hitt NP 07/18/23@3 :30pm. Appt letter mailed to pt and pt notified via mychart.

## 2023-07-02 ENCOUNTER — Ambulatory Visit: Admitting: Physician Assistant

## 2023-07-02 ENCOUNTER — Encounter: Payer: Self-pay | Admitting: Physician Assistant

## 2023-07-02 VITALS — BP 106/63 | HR 82 | Ht 65.0 in | Wt 270.4 lb

## 2023-07-02 DIAGNOSIS — E1165 Type 2 diabetes mellitus with hyperglycemia: Secondary | ICD-10-CM

## 2023-07-02 DIAGNOSIS — M7711 Lateral epicondylitis, right elbow: Secondary | ICD-10-CM | POA: Diagnosis not present

## 2023-07-02 DIAGNOSIS — R110 Nausea: Secondary | ICD-10-CM

## 2023-07-02 DIAGNOSIS — D500 Iron deficiency anemia secondary to blood loss (chronic): Secondary | ICD-10-CM | POA: Diagnosis not present

## 2023-07-02 DIAGNOSIS — Z7985 Long-term (current) use of injectable non-insulin antidiabetic drugs: Secondary | ICD-10-CM

## 2023-07-02 DIAGNOSIS — D649 Anemia, unspecified: Secondary | ICD-10-CM

## 2023-07-02 MED ORDER — FERROUS SULFATE 325 (65 FE) MG PO TBEC: 325.0000 mg | DELAYED_RELEASE_TABLET | Freq: Every day | ORAL | 1 refills | Status: DC

## 2023-07-02 MED ORDER — ONDANSETRON 4 MG PO TBDP: 4.0000 mg | ORAL_TABLET | Freq: Three times a day (TID) | ORAL | 3 refills | Status: AC | PRN

## 2023-07-02 NOTE — Progress Notes (Signed)
 Established patient visit   Patient: Anna Chapman   DOB: 1980-02-24   43 y.o. Female  MRN: [id number] Visit Date: 07/02/2023  Today's healthcare provider: Trenton Frock, PA-C   Cc. Right elbow pain  Subjective     Pt was seen in ED 6/6 after presenting to urgent care for right arm pain, lack of ROM, and swelling. She was sent to ED for DVT r/o. At ED she also c/o dizziness.  Trop normal, US  RUE to r/o DVT --found Small 1.2 cm fluid collection in the right lateral forearm at the elbow in the area of pain. Correlate for lateral epicondylitis. . Chest xray normal.   Pt reports she was cooking and cleaning all day the day before her elbow starting hurting.   Discussed the use of AI scribe software for clinical note transcription with the patient, who gave verbal consent to proceed.  History of Present Illness   Anna Chapman is a 43 year old female who presents with worsening anemia symptoms   Anemia symptoms have worsened, including loss of appetite, particularly in the mornings and evenings, and nausea after taking her shots.   She is on a 0.5 mg dose of ozempic , which she believes affects her appetite. She experiences lightheadedness, headaches  She has not been taking previously recommended iron supplements.  She was having heavy menstrual bleeding, but this has resolved.  She experiences chest pain and fatigue, especially during physical activities.         Medications: Outpatient Medications Prior to Visit  Medication Sig   aspirin  EC 81 MG tablet Take 1 tablet (81 mg total) by mouth daily. Swallow whole.   bisoprolol-hydrochlorothiazide (ZIAC) 5-6.25 MG tablet Take 1 tablet by mouth daily. (Patient not taking: Reported on 06/12/2023)   dapagliflozin  propanediol (FARXIGA ) 10 MG TABS tablet Take 1 tablet (10 mg total) by mouth daily.   Drospirenone  (SLYND ) 4 MG TABS Take 1 tablet (4 mg total) by mouth daily.   ezetimibe  (ZETIA ) 10 MG tablet Take 1 tablet (10 mg  total) by mouth daily.   fluticasone  (FLONASE ) 50 MCG/ACT nasal spray Place 2 sprays into both nostrils daily.   metoprolol  succinate (TOPROL  XL) 25 MG 24 hr tablet Take 0.5 tablets (12.5 mg total) by mouth at bedtime.   olopatadine  (PATADAY ) 0.1 % ophthalmic solution Place 1 drop into both eyes 2 (two) times daily.   pantoprazole  (PROTONIX ) 40 MG tablet TAKE 1 TABLET BY MOUTH TWICE DAILY 30  MINUTES  BEFORE  BREAKFAST  AND  DINNER.   rosuvastatin  (CRESTOR ) 5 MG tablet Take 1 tablet (5 mg total) by mouth daily.   sacubitril -valsartan  (ENTRESTO ) 97-103 MG Take 1 tablet by mouth 2 (two) times daily.   Semaglutide -Weight Management (WEGOVY ) 0.25 MG/0.5ML SOAJ Inject 0.25 mg into the skin once a week. For 4 weeks.   Semaglutide -Weight Management (WEGOVY ) 0.5 MG/0.5ML SOAJ Inject 0.5 mg into the skin once a week. Start after 4 weeks of 0.25 mg   spironolactone  (ALDACTONE ) 25 MG tablet Take 1 tablet (25 mg total) by mouth daily.   sucralfate  (CARAFATE ) 1 GM/10ML suspension Take 10 mLs (1 g total) by mouth in the morning, at noon, and at bedtime.   torsemide  (DEMADEX ) 20 MG tablet Take 2 tablets (40 mg total) by mouth daily.   [DISCONTINUED] ferrous sulfate  325 (65 FE) MG EC tablet Take 1 tablet (325 mg total) by mouth daily.   No facility-administered medications prior to visit.    Review of Systems  Constitutional:  Positive for fatigue. Negative for fever.  Respiratory:  Negative for cough and shortness of breath.   Cardiovascular:  Negative for chest pain and leg swelling.  Gastrointestinal:  Positive for nausea. Negative for abdominal pain.  Neurological:  Positive for light-headedness and headaches. Negative for dizziness.       Objective    BP 106/63   Pulse 82   Ht 5' 5 (1.651 m)   Wt 270 lb 6.4 oz (122.7 kg)   LMP 05/26/2023 (Approximate)   BMI 45.00 kg/m    Physical Exam Vitals reviewed.  Constitutional:      Appearance: She is not ill-appearing.  HENT:     Head:  Normocephalic.   Eyes:     Conjunctiva/sclera: Conjunctivae normal.    Cardiovascular:     Rate and Rhythm: Normal rate.  Pulmonary:     Effort: Pulmonary effort is normal. No respiratory distress.   Musculoskeletal:     Comments: Right elbow without edema, erythema. Non tender to medial and later epicondyle. Full ROM w/o pain   Neurological:     Mental Status: She is alert and oriented to person, place, and time.   Psychiatric:        Mood and Affect: Mood normal.        Behavior: Behavior normal.     No results found for any visits on 07/02/23.  Assessment & Plan    Anemia, iron def Cause was ida 2/2 to heavy menstrual bleeding, if menstrual bleeding has resolved, anemia should be improving, not worsening.  Hemoglobin & Hematocrit     Component Value Date/Time   HGB 9.8 (L) 06/21/2023 1840   HGB 11.0 (L) 02/14/2023 1314   HCT 29.9 (L) 06/21/2023 1840   HCT 34.4 02/14/2023 1314   Repeat cbc, iron panel. Rx iron supplements recommending miralax/stool softeners/hydration to mitigate constipation.  If h/h lower than 6/6 will refer to heme.  -     IBC + Ferritin -     CBC with Differential/Platelet  Type 2 diabetes mellitus with hyperglycemia, without long-term current use of insulin (HCC) nausea Will stay on ozempic  0.5 mg. Higher doses will further suppress appetite, worsen nausea. Rx zofran.   Wt Readings from Last 3 Encounters:  07/02/23 270 lb 6.4 oz (122.7 kg)  06/21/23 273 lb (123.8 kg)  06/12/23 268 lb (121.6 kg)    -     Basic metabolic panel with GFR -     Hemoglobin A1c  Lateral epicondylitis of right elbow If symptoms return, recommend ice, ace wrap, rest.   Return in about 3 months (around 10/02/2023) for DMII.       Trenton Frock, PA-C  [facility] at [facility] [phone] (phone) [phone] (fax)  [facility]

## 2023-07-03 ENCOUNTER — Ambulatory Visit: Payer: Self-pay | Admitting: Physician Assistant

## 2023-07-03 LAB — BASIC METABOLIC PANEL WITH GFR
BUN: 16 mg/dL (ref 6–23)
CO2: 27 meq/L (ref 19–32)
Calcium: 9.3 mg/dL (ref 8.4–10.5)
Chloride: 104 meq/L (ref 96–112)
Creatinine, Ser: 0.92 mg/dL (ref 0.40–1.20)
GFR: 76.76 mL/min (ref >60.00)
Glucose, Bld: 86 mg/dL (ref 70–99)
Potassium: 3.7 meq/L (ref 3.5–5.1)
Sodium: 139 meq/L (ref 135–145)

## 2023-07-03 LAB — CBC WITH DIFFERENTIAL/PLATELET
Basophils Absolute: 0.1 10*3/uL (ref 0.0–0.1)
Basophils Relative: 1.3 % (ref 0.0–3.0)
Eosinophils Absolute: 0.2 10*3/uL (ref 0.0–0.7)
Eosinophils Relative: 2.6 % (ref 0.0–5.0)
HCT: 32.8 % — ABNORMAL LOW (ref 36.0–46.0)
Hemoglobin: 10.7 g/dL — ABNORMAL LOW (ref 12.0–15.0)
Lymphocytes Relative: 21.8 % (ref 12.0–46.0)
Lymphs Abs: 1.9 10*3/uL (ref 0.7–4.0)
MCHC: 32.7 g/dL (ref 30.0–36.0)
MCV: 86 fl (ref 78.0–100.0)
Monocytes Absolute: 0.8 10*3/uL (ref 0.1–1.0)
Monocytes Relative: 9.2 % (ref 3.0–12.0)
Neutro Abs: 5.6 10*3/uL (ref 1.4–7.7)
Neutrophils Relative %: 65.1 % (ref 43.0–77.0)
Platelets: 394 10*3/uL (ref 150.0–400.0)
RBC: 3.82 Mil/uL — ABNORMAL LOW (ref 3.87–5.11)
RDW: 15.8 % — ABNORMAL HIGH (ref 11.5–15.5)
WBC: 8.6 10*3/uL (ref 4.0–10.5)

## 2023-07-03 LAB — HEMOGLOBIN A1C: Hgb A1c MFr Bld: 5.9 % (ref 4.6–6.5)

## 2023-07-03 LAB — IBC + FERRITIN
Ferritin: 25.2 ng/mL (ref 10.0–291.0)
Iron: 44 ug/dL (ref 42–145)
Saturation Ratios: 10.9 % — ABNORMAL LOW (ref 20.0–50.0)
TIBC: 403.2 ug/dL (ref 250.0–450.0)
Transferrin: 288 mg/dL (ref 212.0–360.0)

## 2023-07-04 ENCOUNTER — Telehealth: Payer: Self-pay

## 2023-07-04 DIAGNOSIS — D500 Iron deficiency anemia secondary to blood loss (chronic): Secondary | ICD-10-CM

## 2023-07-04 MED ORDER — FERROUS SULFATE 325 (65 FE) MG PO TBEC: 325.0000 mg | DELAYED_RELEASE_TABLET | Freq: Every day | ORAL | 1 refills | Status: AC

## 2023-07-04 NOTE — Telephone Encounter (Signed)
 Rx sent to [facility]

## 2023-07-04 NOTE — Telephone Encounter (Signed)
 Copied from CRM [id number]. Topic: Clinical - Prescription Issue >> Jul 04, 2023 12:36 PM Mesmerise C wrote: Reason for CRM: Sandra Crouch stated that Anna Chapman was out of patient's Iron medication that she gets filled would like prescription to be sent to [id number] - [place], [place] - [address] [place] [address] Phone: [phone] Fax: [phone] Hours: Not open 24 hours

## 2023-07-18 ENCOUNTER — Ambulatory Visit: Admitting: Nurse Practitioner

## 2023-08-05 ENCOUNTER — Other Ambulatory Visit: Payer: Self-pay

## 2023-08-06 ENCOUNTER — Ambulatory Visit (HOSPITAL_BASED_OUTPATIENT_CLINIC_OR_DEPARTMENT_OTHER)
Admission: RE | Admit: 2023-08-06 | Discharge: 2023-08-06 | Disposition: A | Discharge status: Home / Self Care | Source: Ambulatory Visit | Attending: Sports Medicine | Admitting: Sports Medicine

## 2023-08-06 ENCOUNTER — Ambulatory Visit (INDEPENDENT_AMBULATORY_CARE_PROVIDER_SITE_OTHER): Admitting: Sports Medicine

## 2023-08-06 ENCOUNTER — Other Ambulatory Visit (HOSPITAL_BASED_OUTPATIENT_CLINIC_OR_DEPARTMENT_OTHER): Payer: Self-pay

## 2023-08-06 ENCOUNTER — Encounter: Payer: Self-pay | Admitting: Sports Medicine

## 2023-08-06 VITALS — BP 118/60 | Ht 65.0 in | Wt 270.0 lb

## 2023-08-06 DIAGNOSIS — M25552 Pain in left hip: Secondary | ICD-10-CM | POA: Diagnosis not present

## 2023-08-06 DIAGNOSIS — M25551 Pain in right hip: Secondary | ICD-10-CM | POA: Insufficient documentation

## 2023-08-06 NOTE — Progress Notes (Signed)
   Subjective:    Patient ID: Anna Chapman, female    DOB: November 15, 1980, 43 y.o.   MRN: [id number]  HPI chief complaint: Bilateral hip pain  Patient presents today with 2 to 3 years of worsening bilateral hip pain.  No trauma.  Pain can be present at rest as well as with activity.  It is diffuse throughout the lateral hip with radiating pain into the thighs.  She has tried Tylenol  and other over-the-counter medications without any pain relief.  No imaging has been done.  Past medical history reviewed Medications reviewed Allergies reviewed   Review of Systems As above    Objective:   Physical Exam  Well-developed, well-nourished.  No acute distress  Examination of both hips show smooth painless hip range of motion with a negative logroll.  She is diffusely tender to palpation along the lateral hips.  Her knees demonstrate genu valgum with standing.  No obvious effusion.      Assessment & Plan:   Chronic bilateral hip pain Greater trochanteric pain syndrome  Patient's symptoms are likely originating from chronic muscle weakness.  I recommend a trial of physical therapy here at the [facility] outpatient PT facility with a follow-up office visit in 4 weeks.  I had initially thought of putting her on an oral anti-inflammatory but, after reviewing her chart, she has some medical comorbidities that do not make that the best option.  Therefore, I recommend over-the-counter pain patches such as Salonpas.  I will also order bilateral hip x-rays and I will MyChart message her with those results when available.  This note was dictated using Dragon naturally speaking software and may contain errors in syntax, spelling, or content which have not been identified prior to signing this note.

## 2023-08-06 NOTE — Patient Instructions (Signed)
 Do your Xrays today, 1st floor [address] We will refer you to PT here at the [facility] in [address]. Try Salonpas patches Follow up in 4 weeks with Dr. Charles

## 2023-08-07 ENCOUNTER — Encounter: Payer: Self-pay | Admitting: Sports Medicine

## 2023-08-15 ENCOUNTER — Other Ambulatory Visit: Payer: Self-pay

## 2023-08-15 ENCOUNTER — Emergency Department (HOSPITAL_BASED_OUTPATIENT_CLINIC_OR_DEPARTMENT_OTHER)
Admission: EM | Admit: 2023-08-15 | Discharge: 2023-08-16 | Disposition: A | Discharge status: Home / Self Care | Attending: Emergency Medicine | Admitting: Emergency Medicine

## 2023-08-15 ENCOUNTER — Emergency Department (HOSPITAL_BASED_OUTPATIENT_CLINIC_OR_DEPARTMENT_OTHER)

## 2023-08-15 ENCOUNTER — Encounter (HOSPITAL_BASED_OUTPATIENT_CLINIC_OR_DEPARTMENT_OTHER): Payer: Self-pay | Admitting: Emergency Medicine

## 2023-08-15 DIAGNOSIS — R079 Chest pain, unspecified: Secondary | ICD-10-CM | POA: Insufficient documentation

## 2023-08-15 DIAGNOSIS — D72829 Elevated white blood cell count, unspecified: Secondary | ICD-10-CM | POA: Insufficient documentation

## 2023-08-15 DIAGNOSIS — E876 Hypokalemia: Secondary | ICD-10-CM | POA: Diagnosis not present

## 2023-08-15 DIAGNOSIS — K219 Gastro-esophageal reflux disease without esophagitis: Secondary | ICD-10-CM

## 2023-08-15 LAB — RESP PANEL BY RT-PCR (RSV, FLU A&B, COVID)  RVPGX2
Influenza A by PCR: NEGATIVE
Influenza B by PCR: NEGATIVE
Resp Syncytial Virus by PCR: NEGATIVE
SARS Coronavirus 2 by RT PCR: NEGATIVE

## 2023-08-15 LAB — PREGNANCY, URINE: Preg Test, Ur: NEGATIVE

## 2023-08-15 MED ORDER — ONDANSETRON 4 MG PO TBDP
4.0000 mg | ORAL_TABLET | Freq: Once | ORAL | Status: AC
  Administered 2023-08-15: 4 mg via ORAL
  Filled 2023-08-15: qty 1

## 2023-08-15 NOTE — ED Notes (Addendum)
 Unsuccessful IV start/blood draw attempt x 2. Pt tolerated well. Pt states historically has to have blood draw by IV guided ultrasound. PT provided heat pack at request.

## 2023-08-15 NOTE — ED Triage Notes (Signed)
 Pt c/o chest pain, generalized body aches, nausea, fever x2 days. Denies known sick contacts.  Poor appetite x1 day.

## 2023-08-15 NOTE — ED Provider Notes (Signed)
 [facility] [place] Provider Note   CSN: [id number] Arrival date & time: 08/15/23  1914     History Chief Complaint  Patient presents with   Chest Pain   Generalized Body Aches    HPI Anna Chapman is a 43 y.o. female presenting for a myriad of symptoms.  She primarily endorses chest pain, generalized fatigue, body aches.  She states that most of her symptoms feel like nausea with worsening of her chronic gastritis's.  She states she has been having worsening belching and after large belches has a sharp chest pain.  She denies fevers chills vomiting syncope shortness of breath otherwise.   Patient's recorded medical, surgical, social, medication list and allergies were reviewed in the Snapshot window as part of the initial history.   Review of Systems   Review of Systems  Constitutional:  Negative for chills and fever.  HENT:  Negative for ear pain and sore throat.   Eyes:  Negative for pain and visual disturbance.  Respiratory:  Negative for cough and shortness of breath.   Cardiovascular:  Positive for chest pain. Negative for palpitations.  Gastrointestinal:  Negative for abdominal pain and vomiting.  Genitourinary:  Negative for dysuria and hematuria.  Musculoskeletal:  Negative for arthralgias and back pain.  Skin:  Negative for color change and rash.  Neurological:  Negative for seizures and syncope.  All other systems reviewed and are negative.   Physical Exam Updated Vital Signs BP 120/81   Pulse 77   Temp 98.2 F (36.8 C) (Oral)   Resp 18   Ht 5' 5 (1.651 m)   Wt 117.5 kg   SpO2 100%   BMI 43.10 kg/m  Physical Exam Vitals and nursing note reviewed.  Constitutional:      General: She is not in acute distress.    Appearance: She is well-developed.  HENT:     Head: Normocephalic and atraumatic.  Eyes:     Conjunctiva/sclera: Conjunctivae normal.  Cardiovascular:     Rate and Rhythm: Normal rate and regular rhythm.      Heart sounds: No murmur heard. Pulmonary:     Effort: Pulmonary effort is normal. No respiratory distress.     Breath sounds: Normal breath sounds.  Abdominal:     Palpations: Abdomen is soft.     Tenderness: There is no abdominal tenderness.  Musculoskeletal:        General: No swelling.     Cervical back: Neck supple.  Skin:    General: Skin is warm and dry.     Capillary Refill: Capillary refill takes less than 2 seconds.  Neurological:     Mental Status: She is alert.  Psychiatric:        Mood and Affect: Mood normal.      ED Course/ Medical Decision Making/ A&P    Procedures Procedures   Medications Ordered in ED Medications  ondansetron  (ZOFRAN -ODT) disintegrating tablet 4 mg (4 mg Oral Given 08/15/23 2248)  pantoprazole  (PROTONIX ) EC tablet 40 mg (40 mg Oral Given 08/16/23 0144)  alum & mag hydroxide-simeth (MAALOX/MYLANTA) 200-200-20 MG/5ML suspension 30 mL (30 mLs Oral Given 08/16/23 0145)  famotidine (PEPCID) tablet 20 mg (20 mg Oral Given 08/16/23 0144)  acetaminophen  (TYLENOL ) tablet 1,000 mg (1,000 mg Oral Given 08/16/23 0144)  Medical Decision Making: Anna Chapman is a 43 y.o. female who presented to the ED today with chest pain, detailed above.  Based on patient's comorbidities, patient has a heart score of 3.  Patient placed on continuous vitals and telemetry monitoring while in ED which was reviewed periodically.  Complete initial physical exam performed, notably the patient was HDS in NAD.   Reviewed and confirmed nursing documentation for past medical history, family history, social history.    Initial Assessment: With the patient's presentation of left-sided chest pain, most likely diagnosis is musculoskeletal chest pain versus GERD, although ACS remains on the differential. Other diagnoses were considered including (but not limited to) pulmonary embolism, community-acquired pneumonia, aortic dissection, pneumothorax, underlying bony abnormality, anemia. These  are considered less likely due to history of present illness and physical exam findings.    In particular, concerning pulmonary embolism: Patient is PERC NEGATIVE and the they deny malignancy, recent surgery, history of DVT, or calf tenderness leading to a LOW risk Wells score. Aortic Dissection also reconsidered but seems less likely based on the location, quality, onset, and severity of symptoms in this case.  Patient also has a lack of underlying history of AD or TAA.  This is most consistent with an acute life/limb threatening illness complicated by underlying chronic conditions.   Initial Plan: EKG and serial troponin to evaluate for cardiac pathology. Evaluate for dissection, bony abnormality, or pneumonia with chest x-ray and screening laboratory evaluation including CBC, BMP  Further evaluation for pulmonary embolism not indicated at this time based on patient's PERC and Wells score.  Further evaluation for Thoracic Aortic Dissection not indicated at this time based on patient's clinical history and PE findings.   Initial Study Results: EKG was reviewed independently. Rate, rhythm, axis, intervals all examined and without medically relevant abnormality. ST segments without concerns for elevations.    Laboratory  Delta  troponin demonstrated NAA   CBC and BMP without obvious metabolic or inflammatory abnormalities requiring further evaluation   Radiology  DG Chest 2 View Result Date: 08/15/2023 CLINICAL DATA:  Chest pain EXAM: CHEST - 2 VIEW COMPARISON:  None Available. FINDINGS: The heart size and mediastinal contours are within normal limits. Both lungs are clear. The visualized skeletal structures are unremarkable. IMPRESSION: No active cardiopulmonary disease. Electronically Signed   By: Dorethia Molt M.D.   On: 08/15/2023 20:53   DG HIPS BILAT WITH PELVIS 3-4 VIEWS Result Date: 08/06/2023 CLINICAL DATA:  Bilateral hip pain.  No known injury. EXAM: DG HIP (WITH OR WITHOUT PELVIS)  4V BILAT COMPARISON:  Left hip radiograph dated 02/28/2021 FINDINGS: There is no evidence of hip fracture or dislocation. Bilateral lateral acetabular over coverage. IMPRESSION: 1. No acute fracture or dislocation. 2. Bilateral lateral acetabular over coverage, which can be seen in the setting of pincer-type femoral acetabular impingement. Electronically Signed   By: Limin  Xu M.D.   On: 08/06/2023 15:57    Final Assessment and Plan: On reassessment, after administration of antacid medications, she has had complete symptomatic resolution. She denies fevers chills nausea vomiting syncope shortness of breath or chest pain on serial reassessment.  She states that she has now recalled that she used to have the same exact syndrome and used to be prescribed sucralfate  with good symptomatic control.  She is requesting repeat of this prescription. Recommend she follow-up closely with her primary care doctor, cardiologist, gastroenterologist for ongoing care and management.  Strict return precautions regarding chest pain, syncope, shortness of breath all reinforced.  Disposition:  I have considered need for hospitalization, however, considering all of the above, I believe this patient is stable for discharge at this time.  Patient/family educated about specific return precautions for given  chief complaint and symptoms.  Patient/family educated about follow-up with PCP .     Patient/family expressed understanding of return precautions and need for follow-up. Patient spoken to regarding all imaging and laboratory results and appropriate follow up for these results. All education provided in verbal form with additional information in written form. Time was allowed for answering of patient questions. Patient discharged.    Emergency Department Medication Summary:   Medications  ondansetron  (ZOFRAN -ODT) disintegrating tablet 4 mg (4 mg Oral Given 08/15/23 2248)  pantoprazole  (PROTONIX ) EC tablet 40 mg (40 mg  Oral Given 08/16/23 0144)  alum & mag hydroxide-simeth (MAALOX/MYLANTA) 200-200-20 MG/5ML suspension 30 mL (30 mLs Oral Given 08/16/23 0145)  famotidine (PEPCID) tablet 20 mg (20 mg Oral Given 08/16/23 0144)  acetaminophen  (TYLENOL ) tablet 1,000 mg (1,000 mg Oral Given 08/16/23 0144)                   Clinical Impression:  1. Chest pain, unspecified type   2. Gastroesophageal reflux disease, unspecified whether esophagitis present      Discharge   Final Clinical Impression(s) / ED Diagnoses Final diagnoses:  Chest pain, unspecified type    Rx / DC Orders ED Discharge Orders          Ordered    sucralfate  (CARAFATE ) 1 GM/10ML suspension  3 times daily       Note to Pharmacy: Pt unable to take pills   08/16/23 0253              Jerral Meth, MD 08/16/23 [phone]

## 2023-08-16 LAB — CBC
HCT: 32.5 % — ABNORMAL LOW (ref 36.0–46.0)
Hemoglobin: 10.8 g/dL — ABNORMAL LOW (ref 12.0–15.0)
MCH: 28.7 pg (ref 26.0–34.0)
MCHC: 33.2 g/dL (ref 30.0–36.0)
MCV: 86.4 fL (ref 80.0–100.0)
Platelets: 390 K/uL (ref 150–400)
RBC: 3.76 MIL/uL — ABNORMAL LOW (ref 3.87–5.11)
RDW: 15.4 % (ref 11.5–15.5)
WBC: 10.6 K/uL — ABNORMAL HIGH (ref 4.0–10.5)
nRBC: 0 % (ref 0.0–0.2)

## 2023-08-16 LAB — BASIC METABOLIC PANEL WITH GFR
Anion gap: 14 (ref 5–15)
BUN: 16 mg/dL (ref 6–20)
CO2: 24 mmol/L (ref 22–32)
Calcium: 9.4 mg/dL (ref 8.9–10.3)
Chloride: 102 mmol/L (ref 98–111)
Creatinine, Ser: 1.16 mg/dL — ABNORMAL HIGH (ref 0.44–1.00)
GFR, Estimated: >60 mL/min (ref >60)
Glucose, Bld: 83 mg/dL (ref 70–99)
Potassium: 3.3 mmol/L — ABNORMAL LOW (ref 3.5–5.1)
Sodium: 140 mmol/L (ref 135–145)

## 2023-08-16 LAB — TROPONIN T, HIGH SENSITIVITY
Troponin T High Sensitivity: <15 ng/L (ref <19)
Troponin T High Sensitivity: <15 ng/L (ref <19)

## 2023-08-16 MED ORDER — SUCRALFATE 1 GM/10ML PO SUSP: 1.0000 g | Freq: Three times a day (TID) | ORAL | 0 refills | Status: DC

## 2023-08-16 MED ORDER — PANTOPRAZOLE SODIUM 40 MG PO TBEC
40.0000 mg | DELAYED_RELEASE_TABLET | Freq: Once | ORAL | Status: AC
  Administered 2023-08-16: 40 mg via ORAL
  Filled 2023-08-16: qty 1

## 2023-08-16 MED ORDER — FAMOTIDINE 20 MG PO TABS
20.0000 mg | ORAL_TABLET | Freq: Once | ORAL | Status: AC
  Administered 2023-08-16: 20 mg via ORAL
  Filled 2023-08-16: qty 1

## 2023-08-16 MED ORDER — ACETAMINOPHEN 500 MG PO TABS
1000.0000 mg | ORAL_TABLET | Freq: Once | ORAL | Status: AC
  Administered 2023-08-16: 1000 mg via ORAL
  Filled 2023-08-16: qty 2

## 2023-08-16 MED ORDER — ALUM & MAG HYDROXIDE-SIMETH 200-200-20 MG/5ML PO SUSP
30.0000 mL | Freq: Once | ORAL | Status: AC
  Administered 2023-08-16: 30 mL via ORAL
  Filled 2023-08-16: qty 30

## 2023-08-21 ENCOUNTER — Other Ambulatory Visit: Payer: Self-pay

## 2023-08-21 MED ORDER — LOSARTAN POTASSIUM 50 MG PO TABS: 50.0000 mg | ORAL_TABLET | Freq: Every day | ORAL | 1 refills | Status: DC

## 2023-08-21 NOTE — Telephone Encounter (Signed)
 Received a need for clarification from [facility] requesting a medication change: Entresto  is not covered please change to something covered. Medication cost is $ 846.25.   Per Dr Wendel he is changing the medication to Losartan  50mg  Take 1 tablet once a day

## 2023-08-21 NOTE — Telephone Encounter (Signed)
 Left detailed message informing the patient that we have changed the Entresto  to Losartan  due to her insurance not covering it. Advised a call back with any questions or concerns.

## 2023-08-22 ENCOUNTER — Ambulatory Visit: Attending: Sports Medicine

## 2023-08-29 ENCOUNTER — Ambulatory Visit: Admitting: Physician Assistant

## 2023-08-29 ENCOUNTER — Encounter: Payer: Self-pay | Admitting: Physician Assistant

## 2023-08-29 VITALS — BP 90/62 | HR 88 | Temp 98.0°F | Ht 65.0 in | Wt 256.8 lb

## 2023-08-29 DIAGNOSIS — M5432 Sciatica, left side: Secondary | ICD-10-CM | POA: Diagnosis not present

## 2023-08-29 DIAGNOSIS — K219 Gastro-esophageal reflux disease without esophagitis: Secondary | ICD-10-CM | POA: Diagnosis not present

## 2023-08-29 DIAGNOSIS — M5431 Sciatica, right side: Secondary | ICD-10-CM

## 2023-08-29 MED ORDER — ROSUVASTATIN CALCIUM 5 MG PO TABS: 5.0000 mg | ORAL_TABLET | Freq: Every day | ORAL | 3 refills | Status: DC

## 2023-08-29 MED ORDER — LOSARTAN POTASSIUM 50 MG PO TABS: 25.0000 mg | ORAL_TABLET | Freq: Every day | ORAL | Status: DC

## 2023-08-29 MED ORDER — GABAPENTIN 300 MG PO CAPS: 300.0000 mg | ORAL_CAPSULE | Freq: Every day | ORAL | 0 refills | Status: DC

## 2023-08-29 NOTE — Progress Notes (Signed)
 Established patient visit   Patient: Anna Chapman   DOB: Sep 24, 1980   43 y.o. Female  MRN: [id number] Visit Date: 08/29/2023  Today's healthcare provider: Manuelita Flatness, PA-C   Chief Complaint  Patient presents with   Back Pain    All lower extremities are in pain & feels hot.  Was taking tylenolgerd   Gastroesophageal Reflux    Taking carafate    Subjective     Discussed the use of AI scribe software for clinical note transcription with the patient, who gave verbal consent to proceed.  History of Present Illness   Anna Chapman is a 43 year old female who presents with worsening leg pain and burning sensation.  She experiences severe burning bilateral leg pain and a burning sensation, affecting her sleep and daily activities. No recent injury or fall is noted. She missed a physical therapy session for her leg pain but has rescheduled.  BP was soft today-- pt reports Dizziness occurs, particularly after showering.  Pt reports persistent burning central chest pain despite pantoprazole  and carafate  daily. She does have an upcoming GI appointment.      Medications: Outpatient Medications Prior to Visit  Medication Sig   aspirin  EC 81 MG tablet Take 1 tablet (81 mg total) by mouth daily. Swallow whole.   dapagliflozin  propanediol (FARXIGA ) 10 MG TABS tablet Take 1 tablet (10 mg total) by mouth daily.   Drospirenone  (SLYND ) 4 MG TABS Take 1 tablet (4 mg total) by mouth daily.   ezetimibe  (ZETIA ) 10 MG tablet Take 1 tablet (10 mg total) by mouth daily.   ferrous sulfate  325 (65 FE) MG EC tablet Take 1 tablet (325 mg total) by mouth daily.   fluticasone  (FLONASE ) 50 MCG/ACT nasal spray Place 2 sprays into both nostrils daily.   metoprolol  succinate (TOPROL  XL) 25 MG 24 hr tablet Take 0.5 tablets (12.5 mg total) by mouth at bedtime.   olopatadine  (PATADAY ) 0.1 % ophthalmic solution Place 1 drop into both eyes 2 (two) times daily.   ondansetron  (ZOFRAN -ODT) 4 MG disintegrating  tablet Take 1 tablet (4 mg total) by mouth every 8 (eight) hours as needed for nausea or vomiting.   pantoprazole  (PROTONIX ) 40 MG tablet TAKE 1 TABLET BY MOUTH TWICE DAILY 30  MINUTES  BEFORE  BREAKFAST  AND  DINNER.   Semaglutide -Weight Management (WEGOVY ) 0.5 MG/0.5ML SOAJ Inject 0.5 mg into the skin once a week. Start after 4 weeks of 0.25 mg   spironolactone  (ALDACTONE ) 25 MG tablet Take 1 tablet (25 mg total) by mouth daily.   sucralfate  (CARAFATE ) 1 GM/10ML suspension Take 10 mLs (1 g total) by mouth in the morning, at noon, and at bedtime.   torsemide  (DEMADEX ) 20 MG tablet Take 2 tablets (40 mg total) by mouth daily.   [DISCONTINUED] bisoprolol-hydrochlorothiazide (ZIAC) 5-6.25 MG tablet Take 1 tablet by mouth daily. (Patient not taking: Reported on 06/12/2023)   [DISCONTINUED] losartan  (COZAAR ) 50 MG tablet Take 1 tablet (50 mg total) by mouth daily.   [DISCONTINUED] rosuvastatin  (CRESTOR ) 5 MG tablet Take 1 tablet (5 mg total) by mouth daily.   No facility-administered medications prior to visit.    Review of Systems  Constitutional:  Negative for fatigue and fever.  Respiratory:  Negative for cough and shortness of breath.   Cardiovascular:  Positive for chest pain. Negative for leg swelling.  Gastrointestinal:  Positive for abdominal pain.  Musculoskeletal:  Positive for back pain.  Neurological:  Negative for dizziness and headaches.  Objective    BP 90/62   Pulse 88   Temp 98 F (36.7 C)   Ht 5' 5 (1.651 m)   Wt 256 lb 12.8 oz (116.5 kg)   BMI 42.73 kg/m    Physical Exam Constitutional:      General: She is awake.     Appearance: She is well-developed.  HENT:     Head: Normocephalic.  Eyes:     Conjunctiva/sclera: Conjunctivae normal.  Cardiovascular:     Rate and Rhythm: Normal rate and regular rhythm.     Heart sounds: Normal heart sounds.  Pulmonary:     Effort: Pulmonary effort is normal.  Skin:    General: Skin is warm.  Neurological:      Mental Status: She is alert and oriented to person, place, and time.  Psychiatric:        Attention and Perception: Attention normal.        Mood and Affect: Mood normal.        Speech: Speech normal.        Behavior: Behavior is cooperative.      No results found for any visits on 08/29/23.  Assessment & Plan    Gastroesophageal reflux disease, unspecified whether esophagitis present - Continue pantoprazole  and Carafate  as prescribed - Advise bland diet, avoiding spicy, greasy, and acidic foods - Encourage eating at least three times a day - Reinforce upcoming GI appointment in two weeks , likely needs endoscopy - Discuss potential for stronger acid medication post-endoscopy  Bilateral sciatica - Prescribe gabapentin  for nighttime use to manage burning pain - Advise monitoring for dizziness as a side effect of gabapentin  - Encourage attendance at rescheduled physical therapy appointment on the 26th -     Gabapentin ; Take 1 capsule (300 mg total) by mouth at bedtime.  Dispense: 30 capsule; Refill: 0  Other orders -     Rosuvastatin  Calcium ; Take 1 tablet (5 mg total) by mouth daily.  Dispense: 90 tablet; Refill: 3  Hypotension - Instruct to cut losartan  50 mg tablet in half to 25 mg - Advise monitoring blood pressure at home to prevent hypotension - Ensure adequate food and fluid intake to support blood pressure    Return if symptoms worsen or fail to improve.       Manuelita Flatness, PA-C  [facility] [phone] (phone) [phone] (fax)  [facility]

## 2023-08-31 ENCOUNTER — Ambulatory Visit: Payer: Self-pay | Admitting: Internal Medicine

## 2023-08-31 DIAGNOSIS — E785 Hyperlipidemia, unspecified: Secondary | ICD-10-CM

## 2023-08-31 LAB — HEPATIC FUNCTION PANEL
ALT: 75 [IU]/L — ABNORMAL HIGH (ref 0–32)
AST: 52 [IU]/L — ABNORMAL HIGH (ref 0–40)
Albumin: 4.1 g/dL (ref 3.9–4.9)
Alkaline Phosphatase: 116 [IU]/L (ref 44–121)
Bilirubin Total: 0.5 mg/dL (ref 0.0–1.2)
Bilirubin, Direct: 0.19 mg/dL (ref 0.00–0.40)
Total Protein: 7.3 g/dL (ref 6.0–8.5)

## 2023-08-31 LAB — LIPID PANEL
Chol/HDL Ratio: 3.3 ratio (ref 0.0–4.4)
Cholesterol, Total: 127 mg/dL (ref 100–199)
HDL: 38 mg/dL — ABNORMAL LOW (ref >39)
LDL Chol Calc (NIH): 68 mg/dL (ref 0–99)
Triglycerides: 117 mg/dL (ref 0–149)
VLDL Cholesterol Cal: 21 mg/dL (ref 5–40)

## 2023-08-31 LAB — LIPOPROTEIN A (LPA): Lipoprotein (a): 205.6 nmol/L — ABNORMAL HIGH (ref <75.0)

## 2023-09-02 ENCOUNTER — Other Ambulatory Visit: Payer: Self-pay | Admitting: Nurse Practitioner

## 2023-09-02 MED ORDER — ROSUVASTATIN CALCIUM 10 MG PO TABS: 10.0000 mg | ORAL_TABLET | Freq: Every day | ORAL | 3 refills | Status: DC

## 2023-09-03 ENCOUNTER — Encounter: Admitting: Physician Assistant

## 2023-09-05 ENCOUNTER — Ambulatory Visit: Admitting: Nurse Practitioner

## 2023-09-09 ENCOUNTER — Ambulatory Visit

## 2023-09-10 ENCOUNTER — Ambulatory Visit: Admitting: Rehabilitation

## 2023-09-10 ENCOUNTER — Ambulatory Visit: Admitting: Physician Assistant

## 2023-09-10 ENCOUNTER — Ambulatory Visit (INDEPENDENT_AMBULATORY_CARE_PROVIDER_SITE_OTHER)

## 2023-09-10 ENCOUNTER — Encounter: Payer: Self-pay | Admitting: Physician Assistant

## 2023-09-10 VITALS — BP 130/102 | HR 81 | Ht 65.0 in | Wt 255.8 lb

## 2023-09-10 VITALS — BP 124/84 | Ht 65.5 in | Wt 255.0 lb

## 2023-09-10 DIAGNOSIS — I10 Essential (primary) hypertension: Secondary | ICD-10-CM | POA: Diagnosis not present

## 2023-09-10 DIAGNOSIS — K219 Gastro-esophageal reflux disease without esophagitis: Secondary | ICD-10-CM

## 2023-09-10 DIAGNOSIS — M1711 Unilateral primary osteoarthritis, right knee: Secondary | ICD-10-CM | POA: Diagnosis not present

## 2023-09-10 DIAGNOSIS — M25551 Pain in right hip: Secondary | ICD-10-CM | POA: Diagnosis not present

## 2023-09-10 DIAGNOSIS — M25552 Pain in left hip: Secondary | ICD-10-CM

## 2023-09-10 DIAGNOSIS — M1712 Unilateral primary osteoarthritis, left knee: Secondary | ICD-10-CM | POA: Diagnosis not present

## 2023-09-10 DIAGNOSIS — Z91199 Patient's noncompliance with other medical treatment and regimen due to unspecified reason: Secondary | ICD-10-CM

## 2023-09-10 NOTE — Assessment & Plan Note (Signed)
 No improvement w/ gabapentin .  Pt no showed her ortho appt and physical therapy.  Again stressed importance of making it to her appointments

## 2023-09-10 NOTE — Progress Notes (Signed)
 Established patient visit   Patient: Anna Chapman   DOB: 05-Jun-1980   43 y.o. Female  MRN: [id number] Visit Date: 09/10/2023  Today's healthcare provider: Manuelita Flatness, PA-C   Cc. Follow up     Subjective     Pt reports continued leg pain, occasional dizziness, GERD symptoms.   Medications: Outpatient Medications Prior to Visit  Medication Sig   losartan  (COZAAR ) 25 MG tablet Take 25 mg by mouth daily.   aspirin  EC 81 MG tablet Take 1 tablet (81 mg total) by mouth daily. Swallow whole.   dapagliflozin  propanediol (FARXIGA ) 10 MG TABS tablet Take 1 tablet (10 mg total) by mouth daily.   Drospirenone  (SLYND ) 4 MG TABS Take 1 tablet (4 mg total) by mouth daily.   ezetimibe  (ZETIA ) 10 MG tablet Take 1 tablet (10 mg total) by mouth daily.   ferrous sulfate  325 (65 FE) MG EC tablet Take 1 tablet (325 mg total) by mouth daily.   fluticasone  (FLONASE ) 50 MCG/ACT nasal spray Place 2 sprays into both nostrils daily.   gabapentin  (NEURONTIN ) 300 MG capsule Take 1 capsule (300 mg total) by mouth at bedtime.   metoprolol  succinate (TOPROL  XL) 25 MG 24 hr tablet Take 0.5 tablets (12.5 mg total) by mouth at bedtime.   olopatadine  (PATADAY ) 0.1 % ophthalmic solution Place 1 drop into both eyes 2 (two) times daily.   ondansetron  (ZOFRAN -ODT) 4 MG disintegrating tablet Take 1 tablet (4 mg total) by mouth every 8 (eight) hours as needed for nausea or vomiting.   pantoprazole  (PROTONIX ) 40 MG tablet TAKE 1 TABLET BY MOUTH TWICE DAILY *USE 30 MINUTES BEFORE BREAKFAST AND DINNER*   rosuvastatin  (CRESTOR ) 10 MG tablet Take 1 tablet (10 mg total) by mouth daily.   Semaglutide -Weight Management (WEGOVY ) 0.5 MG/0.5ML SOAJ Inject 0.5 mg into the skin once a week. Start after 4 weeks of 0.25 mg   spironolactone  (ALDACTONE ) 25 MG tablet Take 1 tablet (25 mg total) by mouth daily.   sucralfate  (CARAFATE ) 1 GM/10ML suspension Take 10 mLs (1 g total) by mouth in the morning, at noon, and at bedtime.    torsemide  (DEMADEX ) 20 MG tablet Take 2 tablets (40 mg total) by mouth daily.   [DISCONTINUED] losartan  (COZAAR ) 50 MG tablet Take 0.5 tablets (25 mg total) by mouth daily.   No facility-administered medications prior to visit.    Review of Systems  Constitutional:  Negative for fatigue and fever.  Respiratory:  Negative for cough and shortness of breath.   Cardiovascular:  Negative for chest pain and leg swelling.  Gastrointestinal:  Positive for abdominal pain.  Musculoskeletal:  Positive for arthralgias.  Neurological:  Positive for dizziness. Negative for headaches.       Objective    BP (!) 130/102   Pulse 81   Ht 5' 5 (1.651 m)   Wt 255 lb 12.8 oz (116 kg)   BMI 42.57 kg/m    Physical Exam Vitals reviewed.  Constitutional:      Appearance: She is not ill-appearing.  HENT:     Head: Normocephalic.  Eyes:     Conjunctiva/sclera: Conjunctivae normal.  Cardiovascular:     Rate and Rhythm: Normal rate.  Pulmonary:     Effort: Pulmonary effort is normal. No respiratory distress.  Neurological:     Mental Status: She is alert and oriented to person, place, and time.  Psychiatric:        Mood and Affect: Mood normal.  Behavior: Behavior normal.      No results found for any visits on 09/10/23.  Assessment & Plan    Gastroesophageal reflux disease, unspecified whether esophagitis present Assessment & Plan: Uncontrolled despite carafate  and PPI.  Pt no showed her recent GI appt.  Reports she was unaware even though we discussed last visit.  Stressed the importance of keeping her appointments   Primary hypertension Assessment & Plan: Pt was initially hypotensive, when checking a wrist BP.  I found this unusual given we changed her BP med. When I checked a manual upper arm-- which was difficult given body habitus, I got 130/102.  Given this is very different than the initial BP I attempted to check again, bilaterally both manually and with two different  BP machines. Unable to hear manually and both machines unable to assess bp.   Cont losartan  25 mg and encourage pt to make a f/u appt with her cardiologist.   Greater trochanteric pain syndrome of both lower extremities Assessment & Plan: No improvement w/ gabapentin .  Pt no showed her ortho appt and physical therapy.  Again stressed importance of making it to her appointments   No-show for appointment Advised pt I will contact nursing/social work to try to make sure she understands when her specialist appts are and to ensure she is getting adequate care. -     AMB Referral VBCI Care Management    Return if symptoms worsen or fail to improve.       Manuelita Flatness, PA-C  [facility] Primary [facility] at [facility] [phone] (phone) [phone] (fax)  [facility]

## 2023-09-10 NOTE — Progress Notes (Unsigned)
   Subjective:    Patient ID: Anna Chapman, female    DOB: 43 y.o., 1980-07-15   MRN: [id number]  HPI  Chief Complaint: bilateral hip pain  Seen by Dr. Arvell previously at this clinic for b/l greater troch pain syndrome At the time Dr. Littie last evaluation of her on 08/06/2023 he felt that her current clinical picture was most consistent with greater trochanteric pain syndrome.  Recommended Salonpas and physical therapy.  He held off on NSAIDs due to her numerous comorbidities including HFrEF, hypertension which could have been affected by such medicines. Today she reports continued pain.  She also reports some element of numbness going down her anterior and lateral thigh which is relatively new for her. No numbness or tingling extending down beyond the knee or posteriorly No other changes in medications  Lab Results  Component Value Date   HGBA1C 5.9 07/02/2023    Objective:   Physical Exam Vitals:   09/10/23 1556  BP: 124/84   Bilateral lower extremities She has tenderness palpation over her greater trochanter, full and intact sensation over her anterior and lateral thighs at this time.  She does have worsening of reported tenderness over anterior and lateral thighs with pressure applied directly over her inguinal ligament bilaterally. Endorsing tenderness to palpation over her bilateral SI joints, distal IT band, proximal IT band, medial and lateral joint lines of both knees.  FABER, FADIR, flexion of both hips yields pain near greater trochanter region bilaterally.     Assessment & Plan:   Anna Chapman is a very pleasant 43 year old female with history of HFrEF, hypertension presenting with continued pain in her bilateral hips with a relatively new complaint of dysesthesia in her anterior and lateral thighs as well as pain in her knees.  I suspect the cause of these is multifactorial.  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  Given this, I offered to do a corticosteroid injection in 1 or both of her hips today (though given her heart failure, my preference would be to only conduct 1 of these injections at a time) however the patient declined this for today and states that since she has not been able to get an appoint with physical therapy, her preference would be to establish care with them today and to follow-up for the injection another day this week.  I believe this is reasonable, though I recognize that she seems to be in quite a bit of pain from these myriad problems.  Recommended she speak directly with the PT office on her way out the door today, and schedule follow-up with me for injection later this week.  Will plan to evaluate knees soon as well.

## 2023-09-10 NOTE — Assessment & Plan Note (Signed)
 Uncontrolled despite carafate  and PPI.  Pt no showed her recent GI appt.  Reports she was unaware even though we discussed last visit.  Stressed the importance of keeping her appointments

## 2023-09-10 NOTE — Assessment & Plan Note (Addendum)
 Pt was initially hypotensive, when checking a wrist BP.  I found this unusual given we changed her BP med. When I checked a manual upper arm-- which was difficult given body habitus, I got 130/102.  Given this is very different than the initial BP I attempted to check again, bilaterally both manually and with two different BP machines. Unable to hear manually and both machines unable to assess bp.   Cont losartan  25 mg and encourage pt to make a f/u appt with her cardiologist.

## 2023-09-17 ENCOUNTER — Ambulatory Visit

## 2023-09-18 ENCOUNTER — Telehealth: Payer: Self-pay

## 2023-09-18 NOTE — Progress Notes (Signed)
 Complex Care Management Note  Care Guide Note 09/18/2023 Name: Anna Chapman MRN: [id number] DOB: 27-Jul-1980  Anna Chapman is a 43 y.o. year old female who sees Cyndi Shaver, [place] for primary care. I reached out to [employer] by phone today to offer complex care management services.  Ms. Manygoats was given information about Complex Care Management services today including:   The Complex Care Management services include support from the care team which includes your Nurse Care Manager, Clinical Social Worker, or Pharmacist.  The Complex Care Management team is here to help remove barriers to the health concerns and goals most important to you. Complex Care Management services are voluntary, and the patient may decline or stop services at any time by request to their care team member.   Complex Care Management Consent Status: Patient agreed to services and verbal consent obtained.   Follow up plan:  Telephone appointment with complex care management team member scheduled for:  09/27/23 at 3:00 p.m.   Encounter Outcome:  Patient Scheduled  Dreama [facility]  [facility], [facility] VBCI Assistant Direct Dial: [phone]  Fax: [phone]

## 2023-09-27 ENCOUNTER — Other Ambulatory Visit: Payer: Self-pay | Admitting: *Deleted

## 2023-09-27 NOTE — Patient Outreach (Signed)
 Complex Care Management   Visit Note  10/25/2023 updated for 09/27/23   Name:  Anna Chapman MRN: [id number] DOB: 10/25/1980  Situation: Referral received for Complex Care Management related to hypertension I obtained verbal consent from Patient.  Visit completed with Patient  on the phone   3 c sections 3 live babies had surgery before first son  Discuss increasing fluid getting 7-8 glasses enc 6 +  heating no shows her necessary specialist appointments. Pcp cannot handle her complex care.  Her young daughter seems to manage all her medications and appointments, and I do not fault her for not being able to keep it all straight. There is no language barrier I do not know why pt relies on her daughter.  Recommending nursing/social work to help facilitate care Need more pain relief med vs NSAIDs  Send iron enrich send my chart ask MD about other pain med to take ob gyn or gi md referrals   Background:   Past Medical History:  Diagnosis Date   GERD (gastroesophageal reflux disease)    Hypertension     Assessment: Patient Reported Symptoms:  Cognitive        Neurological      HEENT        Cardiovascular      Respiratory      Endocrine      Gastrointestinal Gastrointestinal Symptoms Reported: Abdominal pain or discomfort, Bleeding, Reflux/heartburn, Constipation, Cramping Additional Gastrointestinal Details: bleeding heavy menses use 3 pads too day, No aware of family history, Tylemol helps to calm it down iron tablets, causes constipation, last BM x 2 this morning      Genitourinary Additional Genitourinary Details: heavy menustration, change 3+ parents passed 26+ years    Integumentary      Musculoskeletal          Psychosocial            10/25/2023    PHQ2-9 Depression Screening   Little interest or pleasure in doing things    Feeling down, depressed, or hopeless    PHQ-2 - Total Score    Trouble falling or staying asleep, or sleeping too much     Feeling tired or having little energy    Poor appetite or overeating     Feeling bad about yourself - or that you are a failure or have let yourself or your family down    Trouble concentrating on things, such as reading the newspaper or watching television    Moving or speaking so slowly that other people could have noticed.  Or the opposite - being so fidgety or restless that you have been moving around a lot more than usual    Thoughts that you would be better off dead, or hurting yourself in some way    PHQ2-9 Total Score    If you checked off any problems, how difficult have these problems made it for you to do your work, take care of things at home, or get along with other people    Depression Interventions/Treatment      There were no vitals filed for this visit.  Medications Reviewed Today   Medications were not reviewed in this encounter     Recommendation:   PCP Follow-up Continue Current Plan of Care  Follow Up Plan:   Telephone follow up appointment date/time:  pending  Rishita Petron L. Ramonita, RN, BSN, CCM [place]  [facility], [facility] RN Care Manager Direct Dial: [phone]  Fax: [phone]

## 2023-09-27 NOTE — Patient Instructions (Signed)
 Visit Information  Thank you for taking time to visit with me today. Please don't hesitate to contact me if I can be of assistance to you before our next scheduled appointment.  Our next appointment is by telephone on pending Please call the care guide team at [phone] if you need to cancel or reschedule your appointment.   Following is a copy of your care plan:   Goals Addressed   None     Please call the Suicide and Crisis Lifeline: 988 call the [place]  National Suicide Prevention Lifeline: (503)735-1439 or TTY: (346) 168-1045 TTY 7868656345) to talk to a trained counselor call 1-800-273-TALK (toll free, 24 hour hotline) call 911 if you are experiencing a Mental Health or Behavioral Health Crisis or need someone to talk to.  Patient verbalizes understanding of instructions and care plan provided today and agrees to view in MyChart. Active MyChart status and patient understanding of how to access instructions and care plan via MyChart confirmed with patient.     Lee-Anne Flicker L. Ramonita, RN, BSN, CCM [place]  [facility], [facility] RN Care Manager Direct Dial: [phone]  Fax: [phone]

## 2023-10-15 ENCOUNTER — Other Ambulatory Visit: Payer: Self-pay

## 2023-10-15 DIAGNOSIS — E1165 Type 2 diabetes mellitus with hyperglycemia: Secondary | ICD-10-CM

## 2023-10-18 ENCOUNTER — Other Ambulatory Visit: Payer: Self-pay | Admitting: Nurse Practitioner

## 2023-10-29 ENCOUNTER — Other Ambulatory Visit: Payer: Self-pay | Admitting: Internal Medicine

## 2023-10-29 ENCOUNTER — Other Ambulatory Visit: Payer: Self-pay | Admitting: *Deleted

## 2023-10-29 DIAGNOSIS — E1165 Type 2 diabetes mellitus with hyperglycemia: Secondary | ICD-10-CM

## 2023-10-29 MED ORDER — WEGOVY 0.5 MG/0.5ML ~~LOC~~ SOAJ: 0.5000 mg | SUBCUTANEOUS | 3 refills | Status: DC

## 2023-10-29 NOTE — Telephone Encounter (Signed)
 [facility]- [address]- requesting refills for Wegovy .  Last fill date 09/17/23.

## 2023-11-06 ENCOUNTER — Other Ambulatory Visit (HOSPITAL_COMMUNITY): Payer: Self-pay

## 2023-11-11 ENCOUNTER — Other Ambulatory Visit (HOSPITAL_COMMUNITY): Payer: Self-pay

## 2023-11-12 ENCOUNTER — Other Ambulatory Visit: Payer: Self-pay

## 2023-11-12 ENCOUNTER — Other Ambulatory Visit

## 2023-11-12 ENCOUNTER — Telehealth: Payer: Self-pay | Admitting: Internal Medicine

## 2023-11-12 NOTE — Telephone Encounter (Signed)
 Spoke with patient and her daughter, she reports increasing chest pain, with shoulder pain radiating down the arm. She expressed having shortness of breath and noticing swelling. Admits that pains are getting worse. Advised patient to go to the emergency room, but pt is hesitant. Advised that I will make appt with DOD on 11/13/23, but ER advisement reviewed again.

## 2023-11-12 NOTE — Progress Notes (Unsigned)
 Cardiology Office Note:   Date:  11/13/2023  ID:  Anna Chapman, DOB 1980-09-12, MRN [id number] PCP: Cyndi Shaver, PA-C (Inactive)  [facility] Providers Cardiologist:  [name] Red, MD {  History of Present Illness:   Anna Chapman is a 43 y.o. female who presents for evaluation of chest pain.  She called yesterday to report chest pain.  She has had an extensive work up at [facility].  She has had a more recent work up at [facility].     Coronary CTA in Feb 2025 demonstrated minimal coronary artery disease.     Echo in Feb 2024 demonstrated an EF of 50 - 55%.  MRI in April demonstrated normal right and left ventricular function and no evidence of infiltrative disease.  She has continued to have chest discomfort.  She was in the emergency room in July and I reviewed these records for this visit.  Her chest discomfort was not thought to be GI and there was no objective evidence of ischemia.  She was sent home with sucralfate .  She does not really think this helped.  She says she does have some discomfort swallowing food.  She says the pain has been there constantly since before the ER visit.  She says it is 10 out of 10.  There is no radiation.  It is midsternal.  She gets hot and cold but she is not describing fevers.  She has not had any cough.  She has had no chills.  She has had no change in her bowel pattern.  She has had no new shortness of breath, PND or orthopnea.     ROS: As stated in the HPI and negative for all other systems.  Studies Reviewed:    EKG:   EKG Interpretation Date/Time:  Wednesday November 13 2023 15:42:15 EDT Ventricular Rate:  80 PR Interval:  198 QRS Duration:  82 QT Interval:  392 QTC Calculation: 452 R Axis:   27  Text Interpretation: Normal sinus rhythm Nonspecific T wave abnormality When compared with ECG of 15-Aug-2023 19:22, No significant change since last tracing Confirmed by Lavona Agent ([id number]) on 11/13/2023 3:55:08 PM    Risk  Assessment/Calculations:              Physical Exam:   VS:  BP 106/72   Pulse 80   Ht 5' 4 (1.626 m)   Wt 258 lb (117 kg)   BMI 44.29 kg/m    Wt Readings from Last 3 Encounters:  11/13/23 258 lb (117 kg)  11/12/23 250 lb (113.4 kg)  09/10/23 255 lb (115.7 kg)     GEN: Well nourished, well developed in no acute distress NECK: No JVD; No carotid bruits CARDIAC: RRR, no murmurs, rubs, gallops RESPIRATORY:  Clear to auscultation without rales, wheezing or rhonchi  ABDOMEN: Soft, non-tender, non-distended EXTREMITIES:  No edema; No deformity   ASSESSMENT AND PLAN:   NICM: She has no heart failure symptoms.  No change in therapy is indicated.  She seems to be euvolemic.   CAD: Her pain is nonanginal.  She has had some very mild plaque noted but nonobstructive and an extensive workup.  While this could be microvascular disease I do not think that is very high on the differential.   Rather I would think GI to be more likely.  I will refer her back to gastroenterology.  Of note if she needs an EGD or further testing she would be at acceptable risk from a cardiac standpoint.  Hyperlipidemia: Continue Zetia  10 mg and trial Crestor  5 mg; check lipid panel, LFTs, LP(a) and 2 months.  Hypertension: Her blood pressure is controlled.  No change in therapy.  Elevated BMI:     She has had good weight loss on semaglutide .  No change in therapy.  Chest pain: As above I will refer her for GI evaluation.       Follow up with APP as previously scheduled.   Signed, Lynwood Schilling, MD

## 2023-11-12 NOTE — Telephone Encounter (Signed)
   Pt c/o of Chest Pain: STAT if active CP, including tightness, pressure, jaw pain, radiating pain to shoulder/upper arm/back, CP unrelieved by Nitro. Symptoms reported of SOB, nausea, vomiting, sweating.  1. Are you having CP right now? yes    2. Are you experiencing any other symptoms (ex. SOB, nausea, vomiting, sweating)? Sob,feet swelling,shoulder and arm pain   3. Is your CP continuous or coming and going? continuous   4. Have you taken Nitroglycerin ? no   5. How long have you been experiencing CP? 1 week    6. If NO CP at time of call then end call with telling Pt to call back or call 911 if Chest pain returns prior to return call from triage team.

## 2023-11-12 NOTE — Patient Outreach (Signed)
 Complex Care Management   Visit Note  11/12/2023  Name:  Anna Chapman MRN: [id number] DOB: 10-13-1980  Situation: Referral received for Complex Care Management related to Heart Failure and HTN I obtained verbal consent from Patient.  Visit completed with Patient  on the phone  Background:   Past Medical History:  Diagnosis Date   GERD (gastroesophageal reflux disease)    Hypertension     Assessment: Patient Reported Symptoms:  Cognitive Cognitive Status: Able to follow simple commands, Alert and oriented to person, place, and time, Normal speech and language skills Cognitive/Intellectual Conditions Management [RPT]: None reported or documented in medical history or problem list   Health Maintenance Behaviors: Annual physical exam, Healthy diet Health Facilitated by: Healthy diet, Rest  Neurological Neurological Review of Symptoms: No symptoms reported    HEENT HEENT Symptoms Reported: No symptoms reported      Cardiovascular Cardiovascular Symptoms Reported: Swelling in legs or feet, Chest pain or discomfort Does patient have uncontrolled Hypertension?: Yes Is patient checking Blood Pressure at home?: No Patient's Recent BP reading at home: Patient reports she has a BP monitor but ran out of batteries. She reports her daughter is supposed to get some more batteries. Note per chart review that PCP had a difficult time obtaining BP 09/10/23 and patient was advised to follow-up with cardiology. Cardiovascular Management Strategies: Routine screening, Weight management, Medication therapy Do You Have a Working Readable Scale?: Yes Weight: 250 lb (113.4 kg) (Patient reported) Cardiovascular Comment: Patient reports her feet have been swelling for the past 2 weeks and she was experiencing chest pain last night, which has been ongoing since ED visit 10/30/23. Note last cardiology follow-up 05/2023. Call placed to cardiology office to report symptoms and request an appointment.   Respiratory Respiratory Symptoms Reported: Shortness of breath Additional Respiratory Details: Patient reports worsening shortness of breath with exertion. She reports shortness of breath is relieved with rest. Patient denies cough. Reports she sleeps on 2 pillows at baseline. Respiratory Management Strategies: Routine screening  Endocrine Endocrine Symptoms Reported: No symptoms reported Is patient diabetic?: Yes Is patient checking blood sugars at home?: No    Gastrointestinal Gastrointestinal Symptoms Reported: Reflux/heartburn Additional Gastrointestinal Details: Patient reports a good appetite. She reports regular BMs, last BM yesterday. Note recent ED visit for reflux, patient reports compliance with Carafate . Patient reports reflux is worse with certain foods. Gastrointestinal Management Strategies: Coping strategies, Medication therapy    Genitourinary Genitourinary Symptoms Reported: No symptoms reported Genitourinary Comment: Reminded patient of upcoming gynecology appointment 11/26/23. Provided patient with address and appointment details via MyChart.  Integumentary Integumentary Symptoms Reported: No symptoms reported    Musculoskeletal Musculoskelatal Symptoms Reviewed: Joint pain Additional Musculoskeletal Details: Chronic knee and hip pain related to arthritis. Note patient has been seen by sports medicine provider, and was to be set up with PT but no showed appointments. Patient reports she declined injections from provider because she was concerned of how they would effect her heart condition. Patient reports she takes Tylenol  for pain relief. Musculoskeletal Management Strategies: Coping strategies, Routine screening, Medication therapy Falls in the past year?: No Number of falls in past year: 1 or less Was there an injury with Fall?: No Fall Risk Category Calculator: 0 Patient Fall Risk Level: Low Fall Risk Patient at Risk for Falls Due to: No Fall Risks Fall risk Follow  up: Falls evaluation completed, Education provided, Falls prevention discussed  Psychosocial Psychosocial Symptoms Reported: No symptoms reported     Quality of Family Relationships: helpful,  involved, stressful Do you feel physically threatened by others?: No    11/12/2023    PHQ2-9 Depression Screening   Little interest or pleasure in doing things    Feeling down, depressed, or hopeless    PHQ-2 - Total Score    Trouble falling or staying asleep, or sleeping too much    Feeling tired or having little energy    Poor appetite or overeating     Feeling bad about yourself - or that you are a failure or have let yourself or your family down    Trouble concentrating on things, such as reading the newspaper or watching television    Moving or speaking so slowly that other people could have noticed.  Or the opposite - being so fidgety or restless that you have been moving around a lot more than usual    Thoughts that you would be better off dead, or hurting yourself in some way    PHQ2-9 Total Score    If you checked off any problems, how difficult have these problems made it for you to do your work, take care of things at home, or get along with other people    Depression Interventions/Treatment      There were no vitals filed for this visit.  Medications Reviewed Today     Reviewed by Arno Rosaline SQUIBB, RN (Registered Nurse) on 11/12/23 at 1514  Med List Status: <None>   Medication Order Taking? Sig Documenting Provider Last Dose Status Informant  aspirin  EC 81 MG tablet [id number] Yes Take 1 tablet (81 mg total) by mouth daily. Swallow whole. Thukkani, Arun K, MD  Active   Drospirenone  (SLYND ) 4 MG TABS [id number]  Take 1 tablet (4 mg total) by mouth daily. Delores Nidia CROME, FNP  Active   ezetimibe  (ZETIA ) 10 MG tablet [id number] Yes Take 1 tablet (10 mg total) by mouth daily. Cyndi Shaver, PA-C  Active   FARXIGA  10 MG TABS tablet [id number] Yes Take 1 tablet by mouth once daily  Thukkani, Arun K, MD  Active   ferrous sulfate  325 (65 FE) MG EC tablet [id number] Yes Take 1 tablet (325 mg total) by mouth daily. Cyndi Shaver, PA-C  Active   fluticasone  (FLONASE ) 50 MCG/ACT nasal spray [id number]  Place 2 sprays into both nostrils daily.  Patient not taking: Reported on 11/12/2023   Cyndi Shaver, PA-C  Active   gabapentin  (NEURONTIN ) 300 MG capsule [id number]  Take 1 capsule (300 mg total) by mouth at bedtime.  Patient not taking: Reported on 11/12/2023   Cyndi Shaver, PA-C  Active   losartan  (COZAAR ) 25 MG tablet [id number] Yes Take 25 mg by mouth daily.  Patient taking differently: Take 12.5 mg by mouth daily.   [provider]  Active   metoprolol  succinate (TOPROL  XL) 25 MG 24 hr tablet [id number] Yes Take 0.5 tablets (12.5 mg total) by mouth at bedtime. Thukkani, Arun K, MD  Active Self  olopatadine  (PATADAY ) 0.1 % ophthalmic solution [id number]  Place 1 drop into both eyes 2 (two) times daily.  Patient not taking: Reported on 11/12/2023   Cyndi Shaver, PA-C  Active   ondansetron  (ZOFRAN -ODT) 4 MG disintegrating tablet [id number] Yes Take 1 tablet (4 mg total) by mouth every 8 (eight) hours as needed for nausea or vomiting. Cyndi Shaver, PA-C  Active   pantoprazole  (PROTONIX ) 40 MG tablet [id number] Yes TAKE 1 TABLET BY MOUTH TWICE DAILY **USE  30  MINUTES  BEFORE  BREAKFAST  AND  DINNER**. Kennedy-Smith,  Colleen M, NP  Active   rosuvastatin  (CRESTOR ) 10 MG tablet [id number] Yes Take 1 tablet (10 mg total) by mouth daily. Thukkani, Arun K, MD  Active   semaglutide -weight management (WEGOVY ) 0.5 MG/0.5ML SOAJ SQ injection [id number]  Inject 0.5 mg into the skin once a week. Start after 4 weeks of 0.25 mg  Patient not taking: Reported on 11/12/2023   Webb, Padonda B, FNP  Active   spironolactone  (ALDACTONE ) 25 MG tablet [id number]  Take 1 tablet (25 mg total) by mouth daily.  Patient not taking: Reported on 11/12/2023   Thukkani, Arun K, MD  Active Self   sucralfate  (CARAFATE ) 1 GM/10ML suspension [id number] Yes Take 10 mLs (1 g total) by mouth in the morning, at noon, and at bedtime. Jerral Meth, MD  Active   torsemide  (DEMADEX ) 20 MG tablet [id number] Yes Take 2 tablets (40 mg total) by mouth daily. Thukkani, Arun K, MD  Active Self            Recommendation:   PCP Follow-up Specialty provider follow-up cardiology, GI, PT (all to be scheduled) Continue Current Plan of Care  Follow Up Plan:   Telephone follow-up within 2 days  Rosaline Finlay, RN MSN [place]  [facility] RN Care Manager Direct Dial: [phone]  Fax: [phone]

## 2023-11-12 NOTE — Patient Instructions (Signed)
 Visit Information  Anna Chapman was given information about Medicaid Managed Care team care coordination services as a part of their [employer] Medicaid benefit. Caralynn Kozma   If you would like to schedule transportation through your [facility] plan, please call the following number at least 2 days in advance of your appointment: [phone]  For information about your ride after you set it up, call Ride Assist at [phone]. Use this number to activate a Will Call pickup, or if your transportation is late for a scheduled pickup. Use this number, too, if you need to make a change or cancel a previously scheduled reservation.  If you need transportation services right away, call [phone]. The after-hours call center is staffed 24 hours to handle ride assistance and urgent reservation requests (including discharges) 365 days a year. Urgent trips include sick visits, hospital discharge requests and life-sustaining treatment.  Call the [facility] Line at [phone], at any time, 24 hours a day, 7 days a week. If you are in danger or need immediate medical attention call 911.   Care plan and visit instructions communicated with the patient verbally today. Patient agrees to receive a copy in MyChart. Active MyChart status and patient understanding of how to access instructions and care plan via MyChart confirmed with patient.     The Managed Medicaid care management team will reach out to the patient again over the next 2 days.  Please monitor for a call from cardiology office regarding symptoms and to schedule an appointment. I have also sent a separate message including your specialist provider address and phone numbers.  Rosaline Finlay, RN MSN [place]  [facility] RN Care Manager Direct Dial: [phone]  Fax: [phone]  Following is a copy of your plan of care:  There are no care plans that you recently modified to display for this  patient.

## 2023-11-13 ENCOUNTER — Ambulatory Visit: Attending: Cardiology | Admitting: Cardiology

## 2023-11-13 ENCOUNTER — Encounter: Payer: Self-pay | Admitting: Cardiology

## 2023-11-13 ENCOUNTER — Other Ambulatory Visit (HOSPITAL_COMMUNITY): Payer: Self-pay

## 2023-11-13 VITALS — BP 106/72 | HR 80 | Ht 64.0 in | Wt 258.0 lb

## 2023-11-13 DIAGNOSIS — I10 Essential (primary) hypertension: Secondary | ICD-10-CM | POA: Insufficient documentation

## 2023-11-13 DIAGNOSIS — I2510 Atherosclerotic heart disease of native coronary artery without angina pectoris: Secondary | ICD-10-CM | POA: Diagnosis present

## 2023-11-13 DIAGNOSIS — R1310 Dysphagia, unspecified: Secondary | ICD-10-CM | POA: Diagnosis present

## 2023-11-13 DIAGNOSIS — I428 Other cardiomyopathies: Secondary | ICD-10-CM | POA: Diagnosis not present

## 2023-11-13 DIAGNOSIS — E785 Hyperlipidemia, unspecified: Secondary | ICD-10-CM | POA: Insufficient documentation

## 2023-11-13 DIAGNOSIS — E1165 Type 2 diabetes mellitus with hyperglycemia: Secondary | ICD-10-CM | POA: Insufficient documentation

## 2023-11-13 MED ORDER — FARXIGA 10 MG PO TABS: 10.0000 mg | ORAL_TABLET | Freq: Every day | ORAL | 3 refills | Status: AC

## 2023-11-13 MED ORDER — SPIRONOLACTONE 25 MG PO TABS: 25.0000 mg | ORAL_TABLET | Freq: Every day | ORAL | 3 refills | Status: AC

## 2023-11-13 MED ORDER — EZETIMIBE 10 MG PO TABS: 10.0000 mg | ORAL_TABLET | Freq: Every day | ORAL | 3 refills | Status: AC

## 2023-11-13 NOTE — Patient Instructions (Addendum)
 Medication Instructions:  Your physician recommends that you continue on your current medications as directed. Please refer to the Current Medication list given to you today.  *If you need a refill on your cardiac medications before your next appointment, please call your pharmacy*  Lab Work: NONE If you have labs (blood work) drawn today and your tests are completely normal, you will receive your results only by: MyChart Message (if you have MyChart) OR A paper copy in the mail If you have any lab test that is abnormal or we need to change your treatment, we will call you to review the results.  Testing/Procedures: NONE  Follow-Up: At [facility], you and your health needs are our priority.  As part of our continuing mission to provide you with exceptional heart care, our providers are all part of one team.  This team includes your primary Cardiologist (physician) and Advanced Practice Providers or APPs (Physician Assistants and Nurse Practitioners) who all work together to provide you with the care you need, when you need it.  Your next appointment:   November 2025   Provider:   APP  We recommend signing up for the patient portal called MyChart.  Sign up information is provided on this After Visit Summary.  MyChart is used to connect with patients for Virtual Visits (Telemedicine).  Patients are able to view lab/test results, encounter notes, upcoming appointments, etc.  Non-urgent messages can be sent to your provider as well.   To learn more about what you can do with MyChart, go to [email or web address].   Other Instructions We have placed a referral to Dr. Leigh for GI. Someone from his office will reach out to you to make an appointment.

## 2023-11-26 ENCOUNTER — Ambulatory Visit: Admitting: Obstetrics and Gynecology

## 2023-11-29 ENCOUNTER — Other Ambulatory Visit: Payer: Self-pay | Admitting: Nurse Practitioner

## 2023-12-04 ENCOUNTER — Ambulatory Visit: Admitting: Gastroenterology

## 2023-12-04 ENCOUNTER — Encounter: Payer: Self-pay | Admitting: Gastroenterology

## 2023-12-04 VITALS — BP 112/70 | HR 72 | Ht 64.0 in | Wt 254.0 lb

## 2023-12-04 DIAGNOSIS — K219 Gastro-esophageal reflux disease without esophagitis: Secondary | ICD-10-CM | POA: Diagnosis not present

## 2023-12-04 DIAGNOSIS — D509 Iron deficiency anemia, unspecified: Secondary | ICD-10-CM

## 2023-12-04 DIAGNOSIS — K2289 Other specified disease of esophagus: Secondary | ICD-10-CM | POA: Diagnosis not present

## 2023-12-04 DIAGNOSIS — I5020 Unspecified systolic (congestive) heart failure: Secondary | ICD-10-CM

## 2023-12-04 IMAGING — DX DG LUMBAR SPINE 2-3V
3 series · 3 of 3 positions shown · non-contrast
Comparison: None.

CLINICAL DATA: Lumbar spine and bilateral hip pain for 2 years.

EXAM:
LUMBAR SPINE - 2-3 VIEW

[l-spine ap]
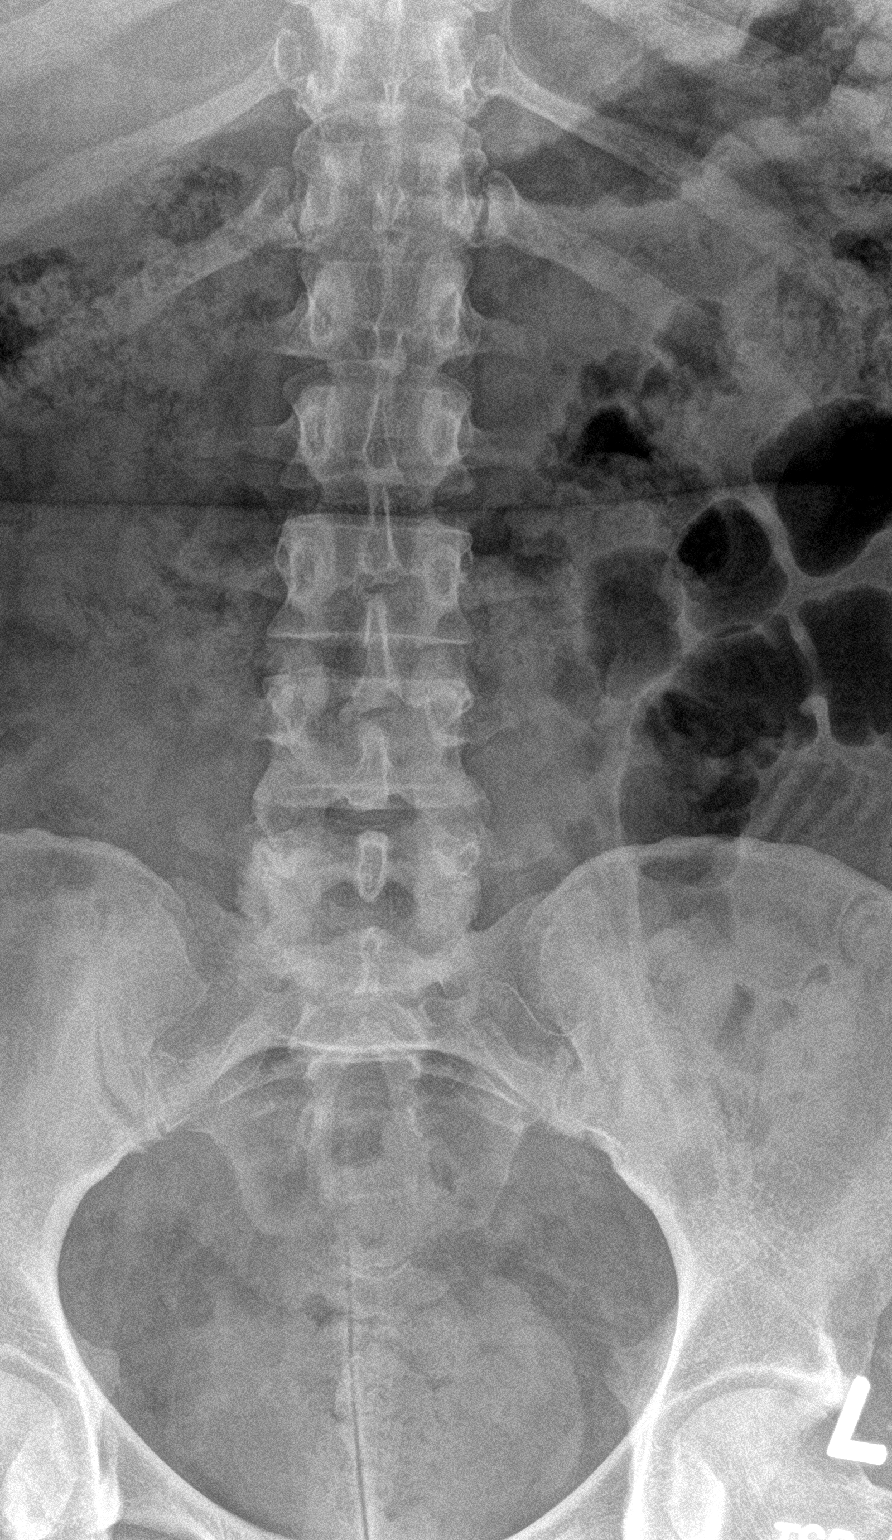

[l-spine lat]
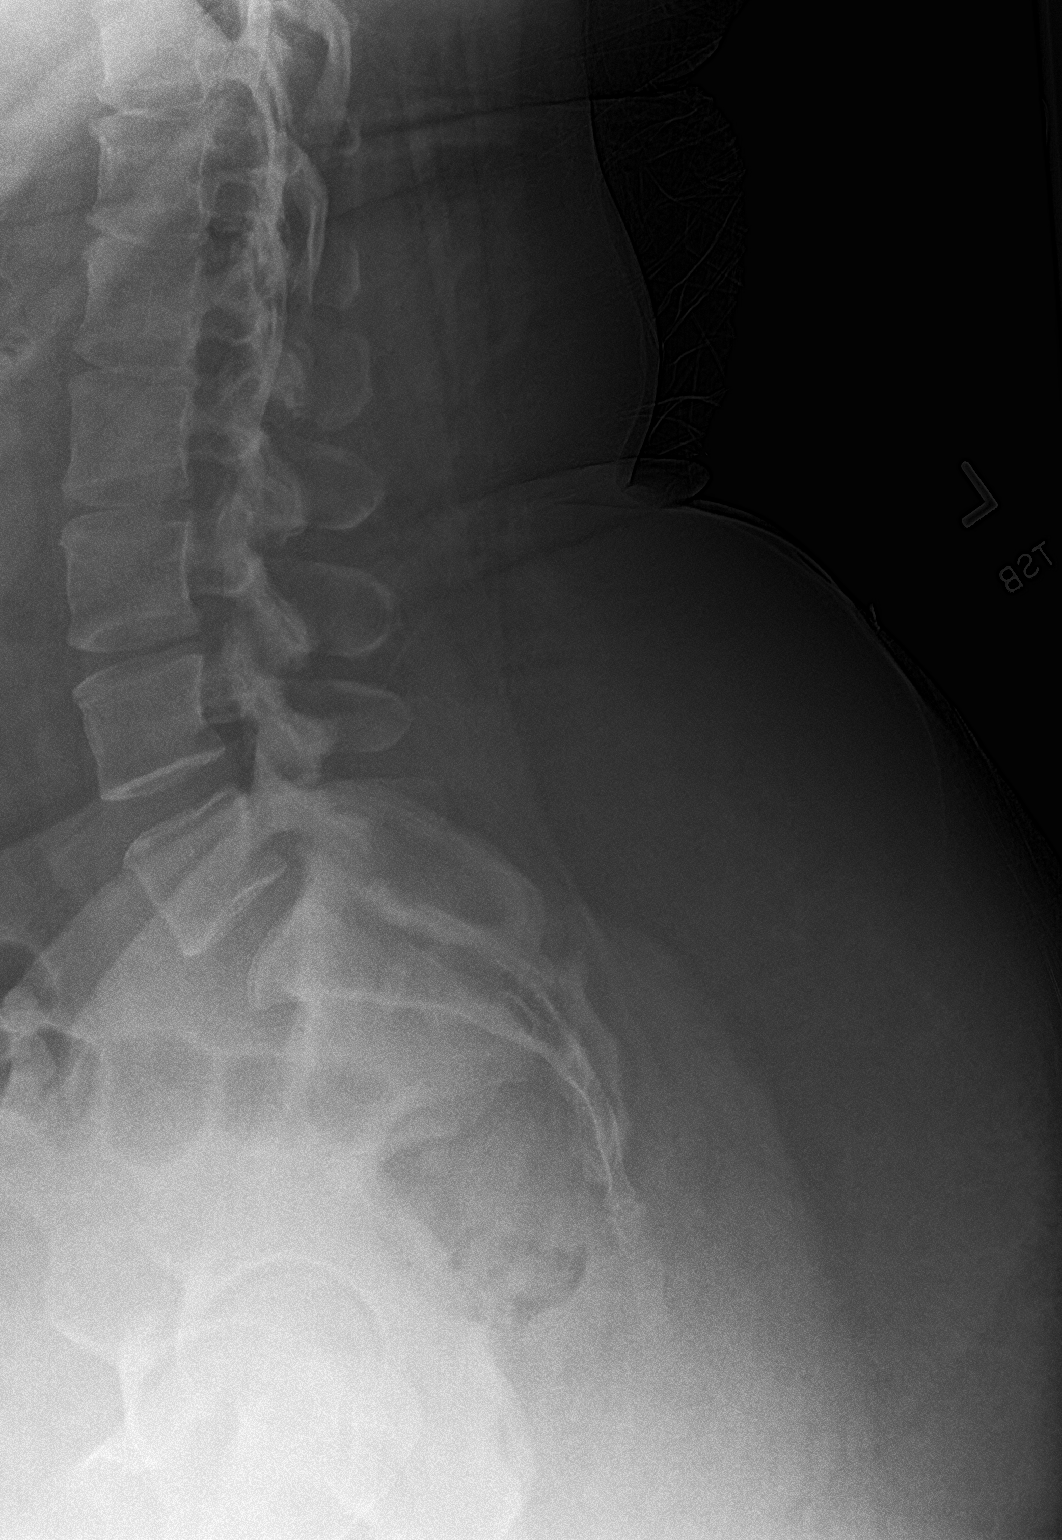

[l-spine spot]
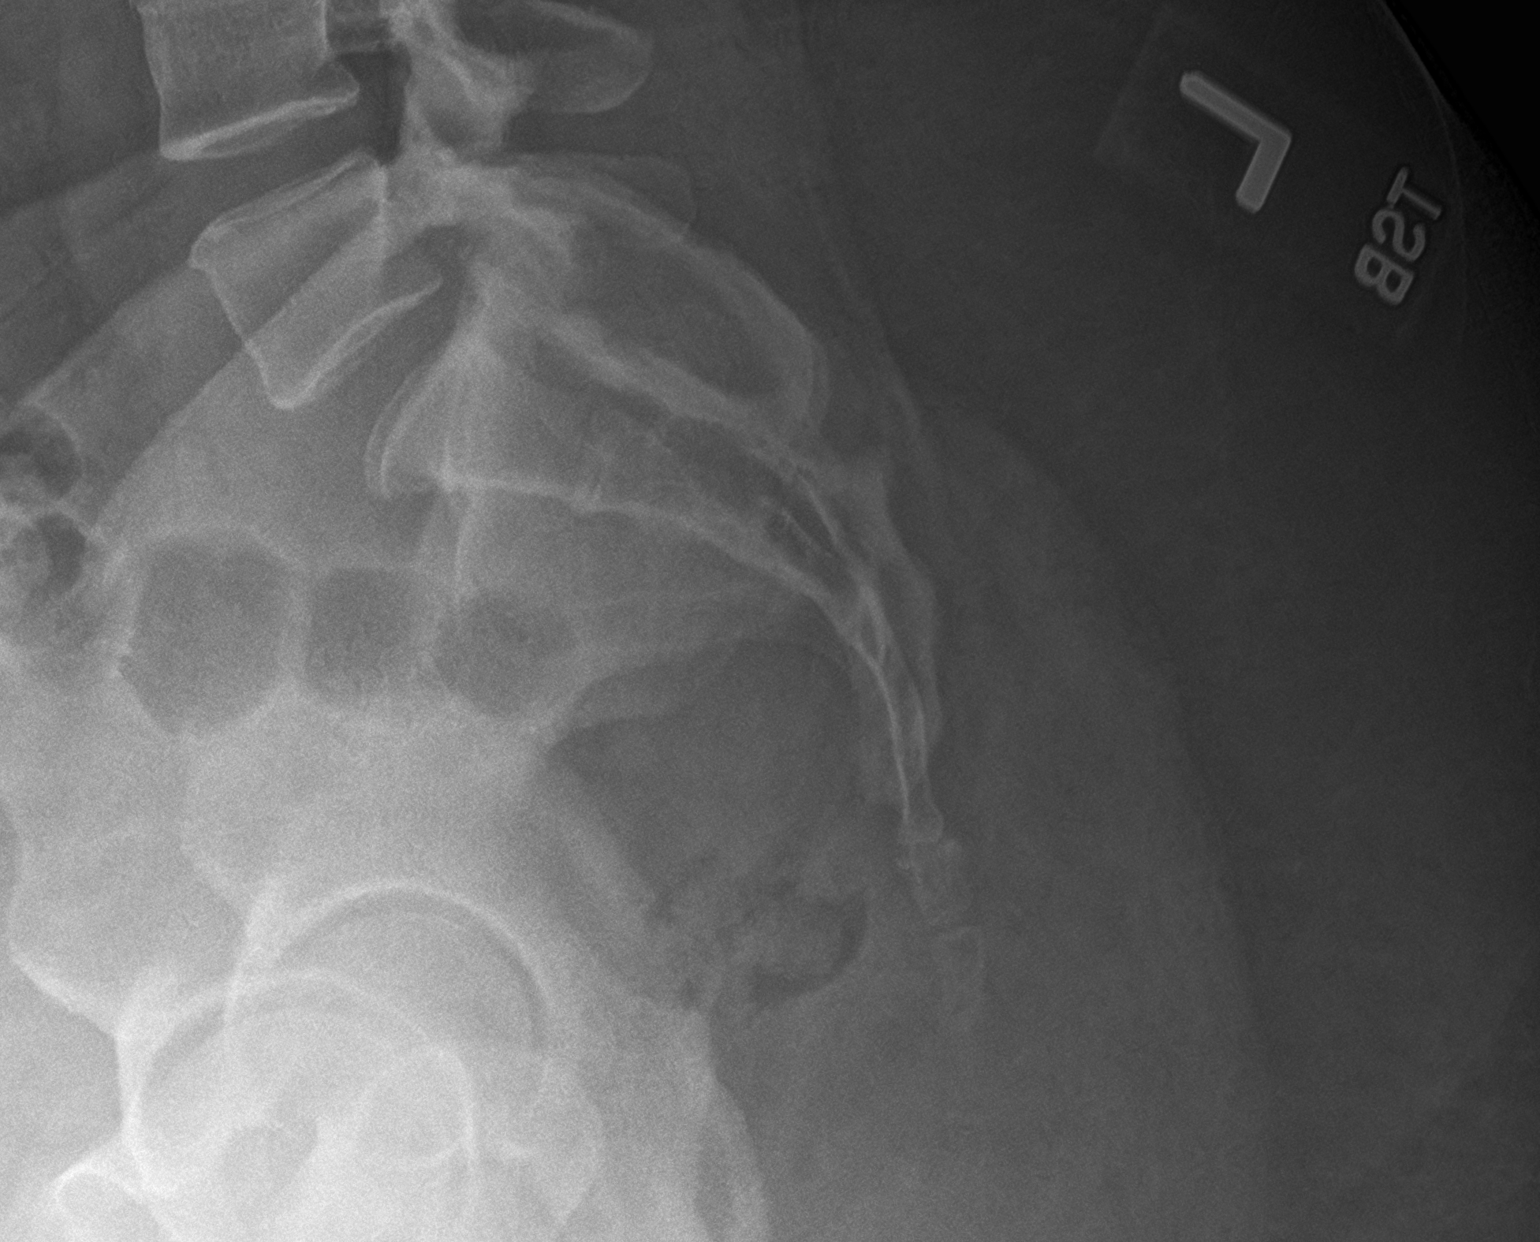

[3 of 3 positions shown; findings below may reference images not displayed]

FINDINGS: There are 5 non-rib-bearing lumbar-type vertebral bodies. Normal
frontal alignment. Normal sagittal alignment. Vertebral body heights
are maintained. Mild T11-12 disc space narrowing and anterior
endplate osteophytosis. Minimal anterior T12-L1 anterior endplate
osteophytosis. No definite lumbar spine disc space narrowing.
IMPRESSION: Mild degenerative disc and endplate changes at T11-12. No
significant degenerative changes within the lumbar spine.

## 2023-12-04 MED ORDER — NA SULFATE-K SULFATE-MG SULF 17.5-3.13-1.6 GM/177ML PO SOLN: 1.0000 | Freq: Once | ORAL | 0 refills | Status: AC

## 2023-12-04 MED ORDER — PANTOPRAZOLE SODIUM 40 MG PO TBEC: 40.0000 mg | DELAYED_RELEASE_TABLET | Freq: Two times a day (BID) | ORAL | 11 refills | Status: AC

## 2023-12-04 MED ORDER — SUCRALFATE 1 GM/10ML PO SUSP: 1.0000 g | Freq: Three times a day (TID) | ORAL | 0 refills | Status: DC

## 2023-12-04 NOTE — Progress Notes (Signed)
 Chief Complaint: Primary GI MD: Dr. Leigh  HPI: 43 year old female with a past medical history of hypertension, chronic systolic heart failure, IDA since 18 and GERD. Past C section x 3.  Here with her daughter  Seen by Elida Nyle Sharps, NP 12/2022 for IDA.  At that time she was also being evaluated for heart failure so EGD/colonoscopy were held off due to pending cardiac workup.  She also has a history of menorrhagia likely contributing to her IDA   Discussed the use of AI scribe software for clinical note transcription with the patient, who gave verbal consent to proceed.  She has iron deficiency anemia. A previous barium swallow showed significant reflux, but the tablet went down well. No current nausea or vomiting.    She experiences trouble swallowing, which improved on PPI and Carafate .  She takes pantoprazole  regularly, which helps with her symptoms, and uses Carafate  as needed for acid reflux. The liquid Carafate  is more effective than the pantoprazole , but she only takes it when necessary. She has run out of pantoprazole .  No current pain. No nausea, vomiting, or pain. Trouble swallowing has improved.   PREVIOUS GI WORKUP   EGD 10/05/2019: - Esophagogastric landmarks identified.  - Normal esophagus otherwise  - Empiric dilation performed to 18mm and biopsies taken to rule out EoE  - Normal stomach.  - Normal duodenal bulb and second portion of the duodenum. Surgical [P], esophagus - ESOPHAGEAL SQUAMOUS MUCOSA WITH MILD VASCULAR CONGESTION, AND FOCAL SQUAMOUS BALLOONING, SUGGESTIVE OF REFLUX ESOPHAGITIS - NEGATIVE FOR INCREASED INTRAEPITHELIAL EOSINOPHILS   Prior EGDs with esophageal dilatation at [facility], records not available.  Past Medical History:  Diagnosis Date   GERD (gastroesophageal reflux disease)    Hypertension     Past Surgical History:  Procedure Laterality Date   CESAREAN SECTION     x3     Current Outpatient Medications  Medication  Sig Dispense Refill   aspirin  EC 81 MG tablet Take 1 tablet (81 mg total) by mouth daily. Swallow whole.     ezetimibe  (ZETIA ) 10 MG tablet Take 1 tablet (10 mg total) by mouth daily. 90 tablet 3   FARXIGA  10 MG TABS tablet Take 1 tablet (10 mg total) by mouth daily. 90 tablet 3   ferrous sulfate  325 (65 FE) MG EC tablet Take 1 tablet (325 mg total) by mouth daily. 90 tablet 1   losartan  (COZAAR ) 25 MG tablet Take 25 mg by mouth daily. (Patient taking differently: Take 12.5 mg by mouth daily.)     metoprolol  succinate (TOPROL  XL) 25 MG 24 hr tablet Take 0.5 tablets (12.5 mg total) by mouth at bedtime. 45 tablet 3   Na Sulfate-K Sulfate-Mg Sulfate concentrate (SUPREP) 17.5-3.13-1.6 GM/177ML SOLN Take 1 kit (354 mLs total) by mouth once for 1 dose. 354 mL 0   olopatadine  (PATADAY ) 0.1 % ophthalmic solution Place 1 drop into both eyes 2 (two) times daily. (Patient taking differently: Place 1 drop into both eyes as needed.) 5 mL 1   ondansetron  (ZOFRAN -ODT) 4 MG disintegrating tablet Take 1 tablet (4 mg total) by mouth every 8 (eight) hours as needed for nausea or vomiting. 20 tablet 3   rosuvastatin  (CRESTOR ) 10 MG tablet Take 1 tablet (10 mg total) by mouth daily. 30 tablet 3   spironolactone  (ALDACTONE ) 25 MG tablet Take 1 tablet (25 mg total) by mouth daily. 90 tablet 3   torsemide  (DEMADEX ) 20 MG tablet Take 2 tablets (40 mg total) by mouth  daily. 180 tablet 3   Drospirenone  (SLYND ) 4 MG TABS Take 1 tablet (4 mg total) by mouth daily. (Patient not taking: Reported on 12/04/2023) 84 tablet 4   fluticasone  (FLONASE ) 50 MCG/ACT nasal spray Place 2 sprays into both nostrils daily. (Patient not taking: Reported on 12/04/2023) 16 g 6   gabapentin  (NEURONTIN ) 300 MG capsule Take 1 capsule (300 mg total) by mouth at bedtime. (Patient not taking: Reported on 12/04/2023) 30 capsule 0   pantoprazole  (PROTONIX ) 40 MG tablet Take 1 tablet (40 mg total) by mouth 2 (two) times daily. USE  30  MINUTES  BEFORE   BREAKFAST  AND  DINNER. 60 tablet 11   semaglutide -weight management (WEGOVY ) 0.5 MG/0.5ML SOAJ SQ injection Inject 0.5 mg into the skin once a week. Start after 4 weeks of 0.25 mg (Patient not taking: Reported on 12/04/2023) 2 mL 3   sucralfate  (CARAFATE ) 1 GM/10ML suspension Take 10 mLs (1 g total) by mouth in the morning, at noon, and at bedtime. 414 mL 0   No current facility-administered medications for this visit.    Allergies as of 12/04/2023 - Review Complete 12/04/2023  Allergen Reaction Noted   Other Other (See Comments) 12/03/2014   Lipitor [atorvastatin ] Itching 04/23/2023    Family History  Problem Relation Age of Onset   Stroke Father    Pancreatic cancer Neg Hx    Stomach cancer Neg Hx    Colon cancer Neg Hx    Esophageal cancer Neg Hx     Social History   Socioeconomic History   Marital status: Divorced    Spouse name: Not on file   Number of children: 3   Years of education: Not on file   Highest education level: Not on file  Occupational History   Occupation: self employed  Tobacco Use   Smoking status: Never   Smokeless tobacco: Never  Vaping Use   Vaping status: Never Used  Substance and Sexual Activity   Alcohol use: Never   Drug use: Never   Sexual activity: Not on file  Other Topics Concern   Not on file  Social History Narrative   Not on file   Social Drivers of Health   Financial Resource Strain: Low Risk  (09/10/2023)   Overall Financial Resource Strain (CARDIA)    Difficulty of Paying Living Expenses: Not hard at all  Food Insecurity: No Food Insecurity (11/12/2023)   Hunger Vital Sign    Worried About Running Out of Food in the Last Year: Never true    Ran Out of Food in the Last Year: Never true  Transportation Needs: No Transportation Needs (11/12/2023)   PRAPARE - Administrator, Civil Service (Medical): No    Lack of Transportation (Non-Medical): No  Physical Activity: Unknown (09/10/2023)   Exercise Vital Sign     Days of Exercise per Week: Patient declined    Minutes of Exercise per Session: Not on file  Stress: No Stress Concern Present (09/10/2023)   [employer] of Occupational Health - Occupational Stress Questionnaire    Feeling of Stress: Only a little  Social Connections: Moderately Isolated (09/10/2023)   Social Connection and Isolation Panel    Frequency of Communication with Friends and Family: More than three times a week    Frequency of Social Gatherings with Friends and Family: Once a week    Attends Religious Services: 1 to 4 times per year    Active Member of Clubs or Organizations: No  Attends Banker Meetings: Not on file    Marital Status: Divorced  Intimate Partner Violence: Not At Risk (11/12/2023)   Humiliation, Afraid, Rape, and Kick questionnaire    Fear of Current or Ex-Partner: No    Emotionally Abused: No    Physically Abused: No    Sexually Abused: No    Review of Systems:    Constitutional: No weight loss, fever, chills, weakness or fatigue HEENT: Eyes: No change in vision               Ears, Nose, Throat:  No change in hearing or congestion Skin: No rash or itching Cardiovascular: No chest pain, chest pressure or palpitations   Respiratory: No SOB or cough Gastrointestinal: See HPI and otherwise negative Genitourinary: No dysuria or change in urinary frequency Neurological: No headache, dizziness or syncope Musculoskeletal: No new muscle or joint pain Hematologic: No bleeding or bruising Psychiatric: No history of depression or anxiety    Physical Exam:  Vital signs: BP 112/70   Pulse 72   Ht 5' 4 (1.626 m)   Wt 254 lb (115.2 kg)   BMI 43.60 kg/m   Constitutional: NAD, alert and cooperative Head:  Normocephalic and atraumatic. Eyes:   PEERL, EOMI. No icterus. Conjunctiva pink. Respiratory: Respirations even and unlabored. Lungs clear to auscultation bilaterally.   No wheezes, crackles, or rhonchi.  Cardiovascular:  Regular rate  and rhythm. No peripheral edema, cyanosis or pallor.  Gastrointestinal:  Soft, nondistended, nontender. No rebound or guarding. Normal bowel sounds. No appreciable masses or hepatomegaly. Rectal:  Declines Msk:  Symmetrical without gross deformities. Without edema, no deformity or joint abnormality.  Neurologic:  Alert and  oriented x4;  grossly normal neurologically.  Skin:   Dry and intact without significant lesions or rashes. Psychiatric: Oriented to person, place and time. Demonstrates good judgement and reason without abnormal affect or behaviors.   RELEVANT LABS AND IMAGING: CBC    Component Value Date/Time   WBC 10.6 (H) 08/15/2023 0026   RBC 3.76 (L) 08/15/2023 0026   HGB 10.8 (L) 08/15/2023 0026   HGB 11.0 (L) 02/14/2023 1314   HCT 32.5 (L) 08/15/2023 0026   HCT 34.4 02/14/2023 1314   PLT 390 08/15/2023 0026   PLT 384 02/14/2023 1314   MCV 86.4 08/15/2023 0026   MCV 87 02/14/2023 1314   MCH 28.7 08/15/2023 0026   MCHC 33.2 08/15/2023 0026   RDW 15.4 08/15/2023 0026   RDW 16.1 (H) 02/14/2023 1314   LYMPHSABS 1.9 07/02/2023 1427   MONOABS 0.8 07/02/2023 1427   EOSABS 0.2 07/02/2023 1427   BASOSABS 0.1 07/02/2023 1427    CMP     Component Value Date/Time   NA 140 08/15/2023 0026   NA 139 02/14/2023 1314   K 3.3 (L) 08/15/2023 0026   CL 102 08/15/2023 0026   CO2 24 08/15/2023 0026   GLUCOSE 83 08/15/2023 0026   BUN 16 08/15/2023 0026   BUN 31 (H) 02/14/2023 1314   CREATININE 1.16 (H) 08/15/2023 0026   CALCIUM  9.4 08/15/2023 0026   PROT 7.3 08/29/2023 1313   ALBUMIN 4.1 08/29/2023 1313   AST 52 (H) 08/29/2023 1313   ALT 75 (H) 08/29/2023 1313   ALKPHOS 116 08/29/2023 1313   BILITOT 0.5 08/29/2023 1313   GFRNONAA >60 08/15/2023 0026     Assessment/Plan:   43 year old female with a history of GERD with active reflux and chest/esophageal pain which occurs only when eating since 02/2022.  EGD  in 2021 showed evidence of mild reflux esophagitis. Barium swallow  12/2022 with moderate GERD On PPI BID and carafate  with adequate relief. -GERD diet  -refill Pantoprazole  40mg  every day -refill Carafate  1gm po  bid not to take within 2 to 4 hours of any other medication  - EGD for IDA (will also evaluate for esophagitis, gastritis, h pylori at this time   Iron deficiency anemia, chronic. Menorrhagia a contributing factor. Rule out GI etiology. Baseline hgb 10, currently stable with recent draw -- EGD/Colonoscopy in [facility] (BMI 43). Recent ECHO with adequate EF -- I thoroughly discussed the procedure with the patient (at bedside) to include nature of the procedure, alternatives, benefits, and risks (including but not limited to bleeding, infection, perforation, anesthesia/cardiac pulmonary complications).  Patient verbalized understanding and gave verbal consent to proceed with procedure.   --continue iron supplement -- Colace 100mg  one capsule po every day if Ferrous Sulfate  causes constipation    Menorrhagia  -Refer to gyn as noted above    Chronic systolic heart failure. On Torsemide , Spironolactone  and Entresto .  -- ECHO 01/2023 with EF 50-55%  Cricket Goodlin Mollie RIGGERS [facility] 12/04/2023, 9:58 AM  Cc: Lavona Agent, MD

## 2023-12-04 NOTE — Patient Instructions (Signed)
 _______________________________________________________  If your blood pressure at your visit was 140/90 or greater, please contact your primary care physician to follow up on this.  _______________________________________________________  If you are age 43 or older, your body mass index should be between 23-30. Your Body mass index is 43.6 kg/m. If this is out of the aforementioned range listed, please consider follow up with your Primary Care Provider.  If you are age 83 or younger, your body mass index should be between 19-25. Your Body mass index is 43.6 kg/m. If this is out of the aformentioned range listed, please consider follow up with your Primary Care Provider.   ________________________________________________________  The [facility] providers would like to encourage you to use MYCHART to communicate with providers for non-urgent requests or questions.  Due to long hold times on the telephone, sending your provider a message by [facility] may be a faster and more efficient way to get a response.  Please allow 48 business hours for a response.  Please remember that this is for non-urgent requests.  _______________________________________________________  [facility] is using a team-based approach to care.  Your team is made up of your doctor and two to three APPS. Our APPS (Nurse Practitioners and Physician Assistants) work with your physician to ensure care continuity for you. They are fully qualified to address your health concerns and develop a treatment plan. They communicate directly with your gastroenterologist to care for you. Seeing the Advanced Practice Practitioners on your physician's team can help you by facilitating care more promptly, often allowing for earlier appointments, access to diagnostic testing, procedures, and other specialty referrals.   We have sent the following medications to your pharmacy for you to pick up at your  convenience: Suprep Protonix  Carafate   You have been scheduled for an endoscopy and colonoscopy. Please follow the written instructions given to you at your visit today.  If you use inhalers (even only as needed), please bring them with you on the day of your procedure.  DO NOT TAKE 7 DAYS PRIOR TO TEST- Trulicity (dulaglutide) Ozempic , Wegovy  (semaglutide ) Mounjaro , Zepbound  (tirzepatide ) Bydureon Bcise (exanatide extended release)  DO NOT TAKE 1 DAY PRIOR TO YOUR TEST Rybelsus (semaglutide ) Adlyxin (lixisenatide) Victoza (liraglutide) Byetta (exanatide) ___________________________________________________________________________  It was a pleasure to see you today!  Thank you for trusting me with your gastrointestinal care!

## 2023-12-05 ENCOUNTER — Encounter: Admitting: Nurse Practitioner

## 2023-12-05 NOTE — Progress Notes (Signed)
 Agree with assessment and plan as outlined.  To clarify, this patient was cleared by cardiology recently for endoscopic evaluation

## 2023-12-09 ENCOUNTER — Encounter: Payer: Self-pay | Admitting: *Deleted

## 2023-12-10 ENCOUNTER — Ambulatory Visit: Attending: Emergency Medicine | Admitting: Emergency Medicine

## 2023-12-10 NOTE — Progress Notes (Deleted)
  Cardiology Office Note:    Date:  12/10/2023  ID:  Anna Chapman, DOB 09-Mar-1980, MRN [id number] PCP: Cyndi Shaver, PA-C (Inactive)  [facility] Providers Cardiologist:  [name] Red, MD { Click to update primary MD,subspecialty MD or APP then REFRESH:1}    {Click to Open Review  :1}   Patient Profile:       Chief Complaint: *** History of Present Illness:  Anna Chapman is a 43 y.o. female with visit-pertinent history of nonischemic cardiomyopathy, coronary artery disease, hyperlipidemia, hypertension, obesity, chest pains  She established with cardiology service in 11/2022 for heart failure.  She was apparently admitted at an outside hospital in February 2024 with acute on chronic systolic heart failure.  Echocardiogram at that time demonstrated an LVEF of 35 to 40% with LVH.  She was diuresed and Entresto  and spironolactone  were started.  She did not undergo cardiac catheterization or coronary CTA imaging at that time.  She underwent echocardiogram on 01/2023 showing LVEF 50 to 55%, no RWMA, normal diastolic parameters, RV function and size normal, trivial MR.  Coronary CTA 02/2023 with coronary calcium  score of 0 and total plaque volume of 20 (72nd percentile).  Cardiac MRI 04/2023 showed normal biventricular chamber size and function with no delayed myocardial enhancement.  There was no evidence of scar, fibrosis, inflammation, or infiltrative process.  LVEF 58%, RVEF 59%.  Seen 05/2023.  She was started on Crestor  5 mg daily.  Her lipoprotein (a) was two 5.6 and LDL of 68.  Last seen in clinic on 11/13/2023 for chest discomfort.  Her discomfort was thought to be GI related and she was referred to gastroenterology.  Discussed the use of AI scribe software for clinical note transcription with the patient, who gave verbal consent to proceed.  History of Present Illness     Review of systems:  Please see the history of present illness. All other systems are reviewed and  otherwise negative. ***      Studies Reviewed:        ***  Risk Assessment/Calculations:   {Does this patient have ATRIAL FIBRILLATION?:[phone]} No BP recorded.  {Refresh Note OR Click here to enter BP  :1}***        Physical Exam:   VS:  There were no vitals taken for this visit.   Wt Readings from Last 3 Encounters:  12/04/23 254 lb (115.2 kg)  11/13/23 258 lb (117 kg)  11/12/23 250 lb (113.4 kg)    GEN: Well nourished, well developed in no acute distress NECK: No JVD; No carotid bruits CARDIAC: ***RRR, no murmurs, rubs, gallops RESPIRATORY:  Clear to auscultation without rales, wheezing or rhonchi  ABDOMEN: Soft, non-tender, non-distended EXTREMITIES:  No edema; No acute deformity ***      Assessment and Plan:    Assessment and Plan Assessment & Plan      {Are you ordering a CV Procedure (e.g. stress test, cath, DCCV, TEE, etc)?   Press F2        :[id number]}  Dispo:  No follow-ups on file.  Signed, Lum LITTIE Louis, NP

## 2023-12-19 ENCOUNTER — Ambulatory Visit: Admitting: Family Medicine

## 2023-12-19 ENCOUNTER — Encounter: Payer: Self-pay | Admitting: Family Medicine

## 2023-12-19 VITALS — BP 108/62 | HR 82 | Ht 64.0 in | Wt 253.0 lb

## 2023-12-19 DIAGNOSIS — K219 Gastro-esophageal reflux disease without esophagitis: Secondary | ICD-10-CM

## 2023-12-19 DIAGNOSIS — E1165 Type 2 diabetes mellitus with hyperglycemia: Secondary | ICD-10-CM

## 2023-12-19 DIAGNOSIS — R3989 Other symptoms and signs involving the genitourinary system: Secondary | ICD-10-CM

## 2023-12-19 DIAGNOSIS — I10 Essential (primary) hypertension: Secondary | ICD-10-CM

## 2023-12-19 DIAGNOSIS — Z0000 Encounter for general adult medical examination without abnormal findings: Secondary | ICD-10-CM

## 2023-12-19 DIAGNOSIS — D509 Iron deficiency anemia, unspecified: Secondary | ICD-10-CM

## 2023-12-19 MED ORDER — SUCRALFATE 1 GM/10ML PO SUSP: 1.0000 g | Freq: Three times a day (TID) | ORAL | 0 refills | Status: DC

## 2023-12-19 MED ORDER — METOPROLOL SUCCINATE ER 25 MG PO TB24: 12.5000 mg | ORAL_TABLET | Freq: Every day | ORAL | 3 refills | Status: AC

## 2023-12-19 NOTE — Assessment & Plan Note (Signed)
 Chronic GERD with ongoing discomfort, worse after meals. Previous EGD in 2021 showed mild reflux esophagitis, and barium swallow in December 2024 showed moderate reflux. Cardiologist ruled out cardiac causes. Awaiting EGD and colonoscopy in January to evaluate further. - Continue pantoprazole  and Carafate  as prescribed by GI - Avoid spicy and greasy foods, overeating, and eating close to bedtime. - Follow up with GI if symptoms worsen before scheduled EGD and colonoscopy.

## 2023-12-19 NOTE — Patient Instructions (Signed)
 Lifestyle measures for reflux: - Avoid meals or carbonated beverages within 3 hours of bedtime - Minimize intake of fried, fatty, and spicy foods (this will help decrease gastric acid production) - Raise the head of the bed using 4-6 inch blocks (especially if symptoms are present at night) - Maintain a healthy weight and avoid tight fitting clothes, especially around the waist  - Avoid foods that relax the sphincter or worsen symptoms (chocolate, peppermint, fatty foods, citrus, spicy foods, tomatoes, coffee, caffeine)  - Minimize use of NSAIDs (ibuprofen, Aleve, etc), nicotine, and alcohol

## 2023-12-19 NOTE — Assessment & Plan Note (Signed)
 Previous A1c of 6.7% eight months ago, but improved to 5.9% this summer. - Rechecked blood work to assess A1c. Consider restarting GLP-1.

## 2023-12-19 NOTE — Progress Notes (Signed)
 New Patient Office Visit   Subjective     Patient ID: Anna Chapman, female   DOB: Jan 19, 1980  Age: 43 y.o. MRN: [id number]   CC:  Chief Complaint  Patient presents with   Medical Management of Chronic Issues      HPI Anna Chapman presents to establish care. She is with her daughter.     Hypertension; Heart Failure, nonischemic cardiomyopathy: Following with cardiology - Dr. Hochrein - Medications: Losartan  12.5 mg daily, Metoprolol  succinate 12.5 mg daily, Spironolactone  25 mg daily, Entresto  97-103 mg BID, and Torsemide  40 mg daily.  - Compliance: none - Checking BP at home: no - Denies any SOB, recurrent headaches, CP, vision changes, LE edema, dizziness, palpitations, or medication side effects. - Diet: heart healthy - Exercise:  - Recent chest pain/GERD - extensive workup unremarkable, cardiology referred to GI.   Hyperlipidemia: - medications: Rosuvastatin  10 mg daily, Zetia  10 mg daily  - compliance: good - medication SEs: none The ASCVD Risk score (Arnett DK, et al., 2019) failed to calculate for the following reasons:   The valid total cholesterol range is 130 to 320 mg/dL   Diabetes: - Checking glucose at home: no - Medications: Farxiga  10 mg daily (previously on Wegovy  for obesity, but stopped due to insurance) - Compliance: good - Eye exam:  - Foot exam:  - Microalbumin:  - Denies symptoms of hypoglycemia, polyuria, polydipsia, numbness extremities, foot ulcers/trauma, wounds that are not healing, medication side effects  Lab Results  Component Value Date   HGBA1C 5.9 07/02/2023   Wt Readings from Last 3 Encounters:  12/19/23 253 lb (114.8 kg)  12/04/23 254 lb (115.2 kg)  11/13/23 258 lb (117 kg)    Iron deficiency anemia: - ferrous sulfate  every day    GERD: Following with GI  - management: Protonix  and Carafate  - symptoms: worse lately; following with GI and planning for EGD/colonoscopy in January        Outpatient Medications Prior  to Visit  Medication Sig   aspirin  EC 81 MG tablet Take 1 tablet (81 mg total) by mouth daily. Swallow whole.   ENTRESTO  97-103 MG Take 1 tablet by mouth 2 (two) times daily.   ezetimibe  (ZETIA ) 10 MG tablet Take 1 tablet (10 mg total) by mouth daily.   FARXIGA  10 MG TABS tablet Take 1 tablet (10 mg total) by mouth daily.   ferrous sulfate  325 (65 FE) MG EC tablet Take 1 tablet (325 mg total) by mouth daily.   losartan  (COZAAR ) 25 MG tablet Take 25 mg by mouth daily. (Patient taking differently: Take 12.5 mg by mouth daily.)   olopatadine  (PATADAY ) 0.1 % ophthalmic solution Place 1 drop into both eyes 2 (two) times daily. (Patient taking differently: Place 1 drop into both eyes as needed.)   ondansetron  (ZOFRAN -ODT) 4 MG disintegrating tablet Take 1 tablet (4 mg total) by mouth every 8 (eight) hours as needed for nausea or vomiting.   pantoprazole  (PROTONIX ) 40 MG tablet Take 1 tablet (40 mg total) by mouth 2 (two) times daily. USE  30  MINUTES  BEFORE  BREAKFAST  AND  DINNER.   rosuvastatin  (CRESTOR ) 10 MG tablet Take 1 tablet (10 mg total) by mouth daily.   spironolactone  (ALDACTONE ) 25 MG tablet Take 1 tablet (25 mg total) by mouth daily.   torsemide  (DEMADEX ) 20 MG tablet Take 2 tablets (40 mg total) by mouth daily.   [DISCONTINUED] metoprolol  succinate (TOPROL  XL) 25 MG 24 hr tablet Take 0.5 tablets (  12.5 mg total) by mouth at bedtime.   [DISCONTINUED] sucralfate  (CARAFATE ) 1 GM/10ML suspension Take 10 mLs (1 g total) by mouth in the morning, at noon, and at bedtime.   [DISCONTINUED] Drospirenone  (SLYND ) 4 MG TABS Take 1 tablet (4 mg total) by mouth daily. (Patient not taking: Reported on 12/04/2023)   [DISCONTINUED] fluticasone  (FLONASE ) 50 MCG/ACT nasal spray Place 2 sprays into both nostrils daily. (Patient not taking: Reported on 12/04/2023)   [DISCONTINUED] gabapentin  (NEURONTIN ) 300 MG capsule Take 1 capsule (300 mg total) by mouth at bedtime. (Patient not taking: Reported on 12/04/2023)    [DISCONTINUED] semaglutide -weight management (WEGOVY ) 0.5 MG/0.5ML SOAJ SQ injection Inject 0.5 mg into the skin once a week. Start after 4 weeks of 0.25 mg (Patient not taking: Reported on 12/04/2023)   No facility-administered medications prior to visit.   Past Medical History:  Diagnosis Date   GERD (gastroesophageal reflux disease)    Hypertension     Past Surgical History:  Procedure Laterality Date   CESAREAN SECTION     x3      Family History  Problem Relation Age of Onset   Stroke Father    Pancreatic cancer Neg Hx    Stomach cancer Neg Hx    Colon cancer Neg Hx    Esophageal cancer Neg Hx     Social History   Socioeconomic History   Marital status: Divorced    Spouse name: Not on file   Number of children: 3   Years of education: Not on file   Highest education level: Not on file  Occupational History   Occupation: self employed  Tobacco Use   Smoking status: Never   Smokeless tobacco: Never  Vaping Use   Vaping status: Never Used  Substance and Sexual Activity   Alcohol use: Never   Drug use: Never   Sexual activity: Not on file  Other Topics Concern   Not on file  Social History Narrative   Not on file   Social Drivers of Health   Financial Resource Strain: Low Risk  (12/19/2023)   Overall Financial Resource Strain (CARDIA)    Difficulty of Paying Living Expenses: Not hard at all  Food Insecurity: No Food Insecurity (12/19/2023)   Hunger Vital Sign    Worried About Running Out of Food in the Last Year: Never true    Ran Out of Food in the Last Year: Never true  Transportation Needs: No Transportation Needs (12/19/2023)   PRAPARE - Administrator, Civil Service (Medical): No    Lack of Transportation (Non-Medical): No  Physical Activity: Inactive (12/19/2023)   Exercise Vital Sign    Days of Exercise per Week: 0 days    Minutes of Exercise per Session: Not on file  Stress: No Stress Concern Present (12/19/2023)   [employer]  of Occupational Health - Occupational Stress Questionnaire    Feeling of Stress: Only a little  Social Connections: Moderately Isolated (12/19/2023)   Social Connection and Isolation Panel    Frequency of Communication with Friends and Family: More than three times a week    Frequency of Social Gatherings with Friends and Family: Once a week    Attends Religious Services: 1 to 4 times per year    Active Member of [employer] or Organizations: No    Attends Engineer, Structural: Not on file    Marital Status: Divorced       ROS All review of systems negative except what is listed in  the HPI    Objective     BP 108/62   Pulse 82   Ht 5' 4 (1.626 m)   Wt 253 lb (114.8 kg)   SpO2 98%   BMI 43.43 kg/m   Physical Exam Vitals reviewed.  Constitutional:      General: She is not in acute distress.    Appearance: Normal appearance. She is obese. She is not ill-appearing.  Cardiovascular:     Rate and Rhythm: Normal rate and regular rhythm.     Heart sounds: Normal heart sounds.  Pulmonary:     Effort: Pulmonary effort is normal.     Breath sounds: Normal breath sounds.  Abdominal:     Tenderness: There is no abdominal tenderness. There is no guarding.  Skin:    General: Skin is warm and dry.  Neurological:     Mental Status: She is alert and oriented to person, place, and time.  Psychiatric:        Mood and Affect: Mood normal.        Behavior: Behavior normal.        Thought Content: Thought content normal.        Judgment: Judgment normal.          Assessment & Plan:     Problem List Items Addressed This Visit       Active Problems   Gastroesophageal reflux disease - Primary   Chronic GERD with ongoing discomfort, worse after meals. Previous EGD in 2021 showed mild reflux esophagitis, and barium swallow in December 2024 showed moderate reflux. Cardiologist ruled out cardiac causes. Awaiting EGD and colonoscopy in January to evaluate further. - Continue  pantoprazole  and Carafate  as prescribed by GI - Avoid spicy and greasy foods, overeating, and eating close to bedtime. - Follow up with GI if symptoms worsen before scheduled EGD and colonoscopy.      Relevant Medications   sucralfate  (CARAFATE ) 1 GM/10ML suspension   Primary hypertension   Blood pressure is at goal for age and co-morbidities.   Recommendations: continue current regimen - BP goal <130/80 - monitor and log blood pressures at home - check around the same time each day in a relaxed setting - Limit salt to <2000 mg/day - Follow DASH eating plan (heart healthy diet) - limit alcohol to 2 standard drinks per day for men and 1 per day for women - avoid tobacco products - get at least 2 hours of regular aerobic exercise weekly Patient aware of signs/symptoms requiring further/urgent evaluation. Labs updated today.       Relevant Medications   ENTRESTO  97-103 MG   metoprolol  succinate (TOPROL  XL) 25 MG 24 hr tablet   Other Relevant Orders   Basic metabolic panel with GFR   CBC with Differential/Platelet   TSH   IDA (iron deficiency anemia)   Currently on daily ferrous sulfate . Labs today. She has noticed some fatigue.       Relevant Orders   CBC with Differential/Platelet   IBC + Ferritin   Type 2 diabetes mellitus with hyperglycemia, without long-term current use of insulin (HCC)   Previous A1c of 6.7% eight months ago, but improved to 5.9% this summer. - Rechecked blood work to assess A1c. Consider restarting GLP-1.       Relevant Orders   Hemoglobin A1c   Microalbumin / creatinine urine ratio   Other Visit Diagnoses       Encounter for medical examination to establish care  Abnormal urine color       Relevant Orders   Urinalysis w microscopic + reflex cultur             Return in about 3 months (around 03/18/2024) for chronic disease management.  Waddell KATHEE Mon, NP  I,Emily Lagle,acting as a scribe for Waddell KATHEE Mon, NP.,have documented all  relevant documentation on the behalf of Waddell KATHEE Mon, NP.  I, Waddell KATHEE Mon, NP, have reviewed all documentation for this visit. The documentation on 12/19/2023 for the exam, diagnosis, procedures, and orders are all accurate and complete.

## 2023-12-19 NOTE — Assessment & Plan Note (Signed)
Blood pressure is at goal for age and co-morbidities.   Recommendations: continue current regimen - BP goal <130/80 - monitor and log blood pressures at home - check around the same time each day in a relaxed setting - Limit salt to <2000 mg/day - Follow DASH eating plan (heart healthy diet) - limit alcohol to 2 standard drinks per day for men and 1 per day for women - avoid tobacco products - get at least 2 hours of regular aerobic exercise weekly Patient aware of signs/symptoms requiring further/urgent evaluation. Labs updated today.  

## 2023-12-19 NOTE — Assessment & Plan Note (Signed)
 Currently on daily ferrous sulfate . Labs today. She has noticed some fatigue.

## 2023-12-20 ENCOUNTER — Ambulatory Visit: Payer: Self-pay | Admitting: Family Medicine

## 2023-12-20 DIAGNOSIS — E876 Hypokalemia: Secondary | ICD-10-CM

## 2023-12-20 LAB — CBC WITH DIFFERENTIAL/PLATELET
Basophils Absolute: 0.1 K/uL (ref 0.0–0.1)
Basophils Relative: 1.3 % (ref 0.0–3.0)
Eosinophils Absolute: 0.3 K/uL (ref 0.0–0.7)
Eosinophils Relative: 3.4 % (ref 0.0–5.0)
HCT: 36.2 % (ref 36.0–46.0)
Hemoglobin: 12 g/dL (ref 12.0–15.0)
Lymphocytes Relative: 25.3 % (ref 12.0–46.0)
Lymphs Abs: 2.3 K/uL (ref 0.7–4.0)
MCHC: 33.2 g/dL (ref 30.0–36.0)
MCV: 90.9 fl (ref 78.0–100.0)
Monocytes Absolute: 0.8 K/uL (ref 0.1–1.0)
Monocytes Relative: 8.6 % (ref 3.0–12.0)
Neutro Abs: 5.7 K/uL (ref 1.4–7.7)
Neutrophils Relative %: 61.4 % (ref 43.0–77.0)
Platelets: 343 K/uL (ref 150.0–400.0)
RBC: 3.99 Mil/uL (ref 3.87–5.11)
RDW: 14 % (ref 11.5–15.5)
WBC: 9.3 K/uL (ref 4.0–10.5)

## 2023-12-20 LAB — URINALYSIS W MICROSCOPIC + REFLEX CULTURE
Bacteria, UA: NONE SEEN /HPF
Bilirubin Urine: NEGATIVE
Hyaline Cast: NONE SEEN /LPF
Ketones, ur: NEGATIVE
Leukocyte Esterase: NEGATIVE
Nitrites, Initial: NEGATIVE
Protein, ur: NEGATIVE
RBC / HPF: >=60 /HPF — AB (ref 0–2)
Specific Gravity, Urine: 1.008 (ref 1.001–1.035)
Squamous Epithelial / HPF: NONE SEEN /HPF (ref <=5)
WBC, UA: NONE SEEN /HPF (ref 0–5)
pH: 7 (ref 5.0–8.0)

## 2023-12-20 LAB — MICROALBUMIN / CREATININE URINE RATIO
Creatinine,U: 19 mg/dL
Microalb Creat Ratio: 246.3 mg/g — ABNORMAL HIGH (ref 0.0–30.0)
Microalb, Ur: 4.7 mg/dL — ABNORMAL HIGH (ref 0.0–1.9)

## 2023-12-20 LAB — BASIC METABOLIC PANEL WITH GFR
BUN: 23 mg/dL (ref 6–23)
CO2: 25 meq/L (ref 19–32)
Calcium: 9.5 mg/dL (ref 8.4–10.5)
Chloride: 98 meq/L (ref 96–112)
Creatinine, Ser: 0.85 mg/dL (ref 0.40–1.20)
GFR: 84.13 mL/min (ref >60.00)
Glucose, Bld: 109 mg/dL — ABNORMAL HIGH (ref 70–99)
Potassium: 3 meq/L — ABNORMAL LOW (ref 3.5–5.1)
Sodium: 137 meq/L (ref 135–145)

## 2023-12-20 LAB — IBC + FERRITIN
Ferritin: 45.9 ng/mL (ref 10.0–291.0)
Iron: 41 ug/dL — ABNORMAL LOW (ref 42–145)
Saturation Ratios: 11.5 % — ABNORMAL LOW (ref 20.0–50.0)
TIBC: 355.6 ug/dL (ref 250.0–450.0)
Transferrin: 254 mg/dL (ref 212.0–360.0)

## 2023-12-20 LAB — HEMOGLOBIN A1C: Hgb A1c MFr Bld: 5.3 % (ref 4.6–6.5)

## 2023-12-20 LAB — TSH: TSH: 2.85 u[IU]/mL (ref 0.35–5.50)

## 2023-12-20 LAB — NO CULTURE INDICATED

## 2023-12-20 MED ORDER — POTASSIUM CHLORIDE CRYS ER 20 MEQ PO TBCR: 20.0000 meq | EXTENDED_RELEASE_TABLET | Freq: Every day | ORAL | 0 refills | Status: AC

## 2023-12-23 ENCOUNTER — Other Ambulatory Visit: Payer: Self-pay | Admitting: Internal Medicine

## 2023-12-23 DIAGNOSIS — E785 Hyperlipidemia, unspecified: Secondary | ICD-10-CM

## 2024-01-28 ENCOUNTER — Telehealth: Payer: Self-pay

## 2024-01-28 NOTE — Telephone Encounter (Signed)
 Error CRM completed. Needs to contact GI where procedure is being done on pre procedure directions.

## 2024-01-28 NOTE — Telephone Encounter (Signed)
 Copied from CRM [id number]. Topic: Clinical - Medication Question >> Jan 28, 2024  4:04 PM Alfonso ORN wrote: Reason for CRM: pt daughter called to get clarification on what medication to take and what not to take with upcoming procedures . And there is a medication that she drinks and wants to know if it should be started day of appointment or day before. Please advise  [phone]

## 2024-01-29 ENCOUNTER — Telehealth: Payer: Self-pay

## 2024-01-29 ENCOUNTER — Telehealth: Payer: Self-pay | Admitting: Gastroenterology

## 2024-01-29 NOTE — Telephone Encounter (Signed)
 Good evening   The following patient has called and rescheduled colonoscopy from 1/15. She stated that there were no clear instructions and she did not prep correctly.

## 2024-01-29 NOTE — Telephone Encounter (Signed)
 Unfortunately this patient did not call us  with enough advanced notice and warrants a late cancellation fee.  She was instructed in the office in November.  Can someone call to reschedule her for EGD / colonoscopy at her convenience, looks like she will need a pre-visit with instructions again. If she does not show up for her next appointment or late cancels again she will need a clinic visit before any additional procedures are scheduled.

## 2024-01-29 NOTE — Telephone Encounter (Signed)
 Pts daughter called wanting instructions for the prep. Asked if her mother had been NPO all day and she does not think so. Discussed with her that if she has been eating and drinking then the procedure will need to be rescheduled. She states she is going to call her mother to see if she has been eating and drinking.

## 2024-01-30 ENCOUNTER — Encounter: Admitting: Gastroenterology

## 2024-01-30 NOTE — Telephone Encounter (Signed)
 Pt had already rescheduled procedure. When spoke with pt she moved it to another day. Previsit scheduled for 03/03/24 at 9am. Pt aware of appts.

## 2024-01-30 NOTE — Telephone Encounter (Signed)
Attempted to call pt, no answer and unable to leave message. 

## 2024-02-04 ENCOUNTER — Other Ambulatory Visit: Payer: Self-pay

## 2024-02-04 ENCOUNTER — Ambulatory Visit

## 2024-02-04 VITALS — Ht 64.0 in | Wt 256.0 lb

## 2024-02-04 DIAGNOSIS — K219 Gastro-esophageal reflux disease without esophagitis: Secondary | ICD-10-CM

## 2024-02-04 DIAGNOSIS — D509 Iron deficiency anemia, unspecified: Secondary | ICD-10-CM

## 2024-02-04 MED ORDER — SUCRALFATE 1 GM/10ML PO SUSP: 1.0000 g | Freq: Two times a day (BID) | ORAL | 2 refills | Status: AC

## 2024-02-04 NOTE — Progress Notes (Signed)
 Patient came to the office and requested a refill of Carafate  sent to [facility]. Last seen by [place] in November and was directed to take 10 ml BID. Refill sent to pharmacy

## 2024-02-04 NOTE — Progress Notes (Signed)
 No egg or soy allergy known to patient  No issues known to pt with past sedation with any surgeries or procedures Patient denies ever being told they had issues or difficulty with intubation  No FH of Malignant Hyperthermia Pt is not on diet pills Pt is not on  home 02  Pt is not on blood thinners  Pt denies issues with constipation  No A fib or A flutter Have any cardiac testing pending--no Pt can ambulate independently Pt denies use of chewing tobacco Discussed diabetic I weight loss medication holds Discussed NSAID holds Checked BMI Pt instructed to use Singlecare.com or GoodRx for a price reduction on prep  Patient's chart reviewed by Anna Chapman CNRA prior to previsit and patient appropriate for the [facility].  Pre visit completed and red dot placed by patient's name on their procedure day (on provider's schedule).

## 2024-03-03 ENCOUNTER — Encounter: Admitting: Gastroenterology

## 2024-03-04 ENCOUNTER — Encounter: Admitting: Gastroenterology

## 2024-03-30 ENCOUNTER — Encounter: Admitting: Family Medicine
